# Patient Record
Sex: Female | Born: 1980 | Race: White | Hispanic: No | Marital: Married | Smoking: Never smoker
Health system: Southern US, Community
[De-identification: ages and names within clinical notes are randomized; demographics above are authoritative.]

## PROBLEM LIST (undated history)

## (undated) DIAGNOSIS — K5120 Ulcerative (chronic) proctitis without complications: Secondary | ICD-10-CM

## (undated) DIAGNOSIS — R197 Diarrhea, unspecified: Secondary | ICD-10-CM

## (undated) DIAGNOSIS — K529 Noninfective gastroenteritis and colitis, unspecified: Secondary | ICD-10-CM

## (undated) HISTORY — PX: NO PAST SURGERIES: SHX2092

## (undated) HISTORY — DX: Ulcerative (chronic) proctitis without complications: K51.20

## (undated) HISTORY — PX: COLONOSCOPY: SHX174

## (undated) HISTORY — DX: Diarrhea, unspecified: R19.7

---

## 2007-02-14 ENCOUNTER — Ambulatory Visit: Payer: Self-pay | Admitting: Internal Medicine

## 2008-04-03 ENCOUNTER — Inpatient Hospital Stay: Payer: Self-pay | Admitting: Unknown Physician Specialty

## 2009-04-20 ENCOUNTER — Inpatient Hospital Stay: Payer: Self-pay

## 2009-04-26 ENCOUNTER — Encounter: Admission: RE | Admit: 2009-04-26 | Discharge: 2009-05-25 | Payer: Self-pay

## 2011-11-22 ENCOUNTER — Inpatient Hospital Stay: Payer: Self-pay

## 2011-11-22 LAB — CBC WITH DIFFERENTIAL/PLATELET
Basophil %: 0.3 %
Eosinophil #: 0 10*3/uL (ref 0.0–0.7)
Eosinophil %: 0 %
HCT: 32.1 % — ABNORMAL LOW (ref 35.0–47.0)
Lymphocyte #: 2.1 10*3/uL (ref 1.0–3.6)
MCH: 31.1 pg (ref 26.0–34.0)
MCV: 93 fL (ref 80–100)
Monocyte #: 1.1 10*3/uL — ABNORMAL HIGH (ref 0.0–0.7)
Monocyte %: 8.5 %
Neutrophil #: 9.5 10*3/uL — ABNORMAL HIGH (ref 1.4–6.5)
Platelet: 270 10*3/uL (ref 150–440)
RBC: 3.46 10*6/uL — ABNORMAL LOW (ref 3.80–5.20)
WBC: 12.7 10*3/uL — ABNORMAL HIGH (ref 3.6–11.0)

## 2011-11-24 LAB — HEMATOCRIT: HCT: 31.1 % — ABNORMAL LOW (ref 35.0–47.0)

## 2011-12-26 ENCOUNTER — Ambulatory Visit: Payer: Self-pay | Admitting: Family Medicine

## 2014-02-14 ENCOUNTER — Observation Stay: Payer: Self-pay | Admitting: Obstetrics and Gynecology

## 2014-02-14 LAB — CBC WITH DIFFERENTIAL/PLATELET
Basophil #: 0.1 10*3/uL (ref 0.0–0.1)
Basophil %: 0.5 %
Eosinophil #: 0 10*3/uL (ref 0.0–0.7)
Eosinophil %: 0.3 %
HCT: 34.9 % — ABNORMAL LOW (ref 35.0–47.0)
HGB: 11.4 g/dL — ABNORMAL LOW (ref 12.0–16.0)
Lymphocyte #: 2.5 10*3/uL (ref 1.0–3.6)
Lymphocyte %: 19.5 %
MCH: 29.6 pg (ref 26.0–34.0)
MCHC: 32.7 g/dL (ref 32.0–36.0)
MCV: 91 fL (ref 80–100)
Monocyte #: 1.2 x10 3/mm — ABNORMAL HIGH (ref 0.2–0.9)
Monocyte %: 9.2 %
Neutrophil #: 8.9 10*3/uL — ABNORMAL HIGH (ref 1.4–6.5)
Neutrophil %: 70.5 %
PLATELETS: 243 10*3/uL (ref 150–440)
RBC: 3.85 10*6/uL (ref 3.80–5.20)
RDW: 13.9 % (ref 11.5–14.5)
WBC: 12.7 10*3/uL — AB (ref 3.6–11.0)

## 2014-02-19 ENCOUNTER — Inpatient Hospital Stay: Payer: Self-pay | Admitting: Obstetrics and Gynecology

## 2014-02-19 LAB — CBC WITH DIFFERENTIAL/PLATELET
BASOS PCT: 0.9 %
Basophil #: 0.1 10*3/uL (ref 0.0–0.1)
Eosinophil #: 0 10*3/uL (ref 0.0–0.7)
Eosinophil %: 0.4 %
HCT: 34 % — ABNORMAL LOW (ref 35.0–47.0)
HGB: 11.4 g/dL — AB (ref 12.0–16.0)
Lymphocyte #: 2.1 10*3/uL (ref 1.0–3.6)
Lymphocyte %: 17.3 %
MCH: 30.4 pg (ref 26.0–34.0)
MCHC: 33.6 g/dL (ref 32.0–36.0)
MCV: 91 fL (ref 80–100)
MONO ABS: 1 x10 3/mm — AB (ref 0.2–0.9)
Monocyte %: 8.5 %
Neutrophil #: 8.9 10*3/uL — ABNORMAL HIGH (ref 1.4–6.5)
Neutrophil %: 72.9 %
Platelet: 239 10*3/uL (ref 150–440)
RBC: 3.76 10*6/uL — ABNORMAL LOW (ref 3.80–5.20)
RDW: 14 % (ref 11.5–14.5)
WBC: 12.2 10*3/uL — ABNORMAL HIGH (ref 3.6–11.0)

## 2014-02-21 LAB — HEMATOCRIT: HCT: 31.2 % — ABNORMAL LOW (ref 35.0–47.0)

## 2014-07-03 ENCOUNTER — Ambulatory Visit: Payer: Self-pay | Admitting: Nurse Practitioner

## 2014-07-03 LAB — COMPREHENSIVE METABOLIC PANEL
AST: 27 U/L (ref 15–37)
Albumin: 3.6 g/dL (ref 3.4–5.0)
Alkaline Phosphatase: 69 U/L
Anion Gap: 6 — ABNORMAL LOW (ref 7–16)
BUN: 10 mg/dL (ref 7–18)
Bilirubin,Total: 0.3 mg/dL (ref 0.2–1.0)
CO2: 28 mmol/L (ref 21–32)
CREATININE: 0.78 mg/dL (ref 0.60–1.30)
Calcium, Total: 9.2 mg/dL (ref 8.5–10.1)
Chloride: 106 mmol/L (ref 98–107)
EGFR (African American): >60
EGFR (Non-African Amer.): >60
Glucose: 94 mg/dL (ref 65–99)
Osmolality: 278 (ref 275–301)
POTASSIUM: 4 mmol/L (ref 3.5–5.1)
SGPT (ALT): 31 U/L
Sodium: 140 mmol/L (ref 136–145)
Total Protein: 7.6 g/dL (ref 6.4–8.2)

## 2014-07-03 LAB — CBC WITH DIFFERENTIAL/PLATELET
BASOS PCT: 0.5 %
Basophil #: 0 10*3/uL (ref 0.0–0.1)
Eosinophil #: 0.1 10*3/uL (ref 0.0–0.7)
Eosinophil %: 1.3 %
HCT: 37.9 % (ref 35.0–47.0)
HGB: 12.8 g/dL (ref 12.0–16.0)
LYMPHS ABS: 2.5 10*3/uL (ref 1.0–3.6)
LYMPHS PCT: 26.6 %
MCH: 30.5 pg (ref 26.0–34.0)
MCHC: 33.8 g/dL (ref 32.0–36.0)
MCV: 90 fL (ref 80–100)
MONO ABS: 0.9 x10 3/mm (ref 0.2–0.9)
MONOS PCT: 9.2 %
NEUTROS ABS: 5.9 10*3/uL (ref 1.4–6.5)
Neutrophil %: 62.4 %
Platelet: 319 10*3/uL (ref 150–440)
RBC: 4.19 10*6/uL (ref 3.80–5.20)
RDW: 11.9 % (ref 11.5–14.5)
WBC: 9.4 10*3/uL (ref 3.6–11.0)

## 2014-10-03 ENCOUNTER — Ambulatory Visit: Payer: Self-pay | Admitting: Gastroenterology

## 2015-02-03 NOTE — H&P (Signed)
L&D Evaluation:  History:  HPI 34 year old G4P3003 at 2733w2d [facility] presenting with regular contractions increasing in intensity this evening.  No LOF, no VB, +FM.  Last cervical check in clinic 2/50/-3   Presents with contractions   Patient's Medical History No Chronic Illness   Patient's Surgical History none   Medications Pre Natal Vitamins   Allergies NKDA   Social History none   ROS:  ROS All systems were reviewed.  HEENT, CNS, GI, GU, Respiratory, CV, Renal and Musculoskeletal systems were found to be normal.   Exam:  Vital Signs stable  131/70   General painfully contracting   Mental Status clear   Chest clear   Abdomen gravid, tender with contractions   Estimated Fetal Weight Small for gestational age   Fetal Position vtx   Back no CVAT   Edema no edema   Pelvic no external lesions, 2.5/50/-3   Mebranes Intact   FHT normal rate with no decels, 130, moderate, + accels, no decels   Ucx irregular, q2-425min   Impression:  Impression Suspect early labor vs braxton hicks contractions   Plan:  Plan EFM/NST, monitor contractions and for cervical change   Comments 1) R/O labor - will recheck in 1hrs to see if any cervical change  2) Fetus - category I tracing, reactive NST     - S<D on recent fundal checks had growth scan scheduled for next week     - appropriate weight gain just under 30lbs this pregnancy     - previous deliveries term 6lbs 12 oz to 7lbs 13oz  3) PNL A positive / ABSC neg / RI / VZI / HBsAg neg / RPR NR / HIV neg / 1-hr 138 / GBS positive   4) TDAP declined 3/26 last received 2013  5) Breast / natural family planning  6) Disposition - pending recheck   Electronic Signatures: Lorrene ReidStaebler, Dareen Gutzwiller M (MD)  (Signed 22-May-15 21:29)  Authored: L&D Evaluation   Last Updated: 22-May-15 21:29 by Lorrene ReidStaebler, Quaid Yeakle M (MD)

## 2015-02-03 NOTE — H&P (Signed)
L&D Evaluation:  History Expanded:  HPI 34 year old G4P3003 at 8194w0d by EDD of 02/26/14 presents with c/o gush of fluid tonight at 1940 and regular ctx. Denies VB or decreased FM. [facility]   Blood Type (Maternal) A positive   Group B Strep Results Maternal (Result >5wks must be treated as unknown) positive   Maternal HIV Negative   Maternal Syphilis Ab Nonreactive   Maternal Varicella Immune   Rubella Results (Maternal) immune   Presents with contractions, leaking fluid   Patient's Medical History No Chronic Illness   Patient's Surgical History none   Medications Pre Natal Vitamins   Allergies NKDA   Social History none   ROS:  ROS All systems were reviewed.  HEENT, CNS, GI, GU, Respiratory, CV, Renal and Musculoskeletal systems were found to be normal.   Exam:  Vital Signs stable   General no apparent distress   Mental Status clear   Chest clear   Heart no murmur/gallop/rubs   Abdomen gravid, tender with contractions   Estimated Fetal Weight Small for gestational age, 6.5 per leopolds   Fetal Position vtx   Back no CVAT   Edema no edema   Pelvic no external lesions, 2.5/75/-2   Mebranes Ruptured, fern & nitrizine positive   Description clear   FHT normal rate with no decels, category 1 tracing   Ucx irregular, q 4-5 min   Impression:  Impression SROM, early labor   Plan:  Plan EFM/NST, monitor contractions and for cervical change, antibiotics for GBBS prophylaxis   Comments Recheck after antibiotics infused x 4 hours, if unchanged consider Pitocin augmentation. Encouraged ambulation with intemittant monitoring if desired.   TDAP declined 3/26 last received 2013 Breastfeeding / natural family planning   Electronic Signatures: Vella KohlerBrothers, Ronda Rajkumar K (CNM)  (Signed 27-May-15 21:26)  Authored: L&D Evaluation   Last Updated: 27-May-15 21:26 by Vella KohlerBrothers, Keigan Girten K (CNM)

## 2015-02-03 NOTE — H&P (Signed)
L&D Evaluation:  History:   HPI 34 yo G3P2002 @ 39.4wks EDC 12/03/11 by LMP presents with SROM.  She called last evening to report that she had a gush of fluid but had not started with ctxs.  She wanted to stay at home until ctxs began.  She was urged to come in since she is GBS pos to have abx started.  On arrival, her cvx was 3 cm.  She denied bleeding or other symptoms.  [facility]  Preg uncomplicated.  She presents with a birth plan which is on her chart.    Presents with leaking fluid    Patient's Medical History No Chronic Illness    Patient's Surgical History none    Medications Pre Natal Vitamins    Allergies NKDA    Social History none    Family History Non-Contributory   ROS:   ROS All systems were reviewed.  HEENT, CNS, GI, GU, Respiratory, CV, Renal and Musculoskeletal systems were found to be normal.   Exam:   Vital Signs stable    General no apparent distress    Mental Status clear    Chest clear    Heart normal sinus rhythm    Abdomen gravid, non-tender    Fetal Position Vertex    Edema no edema    Pelvic 3.5/50/-2    Mebranes Ruptured    Description clear    FHT normal rate with no decels    Fetal Heart Rate 120    Ucx irregular    Skin dry   Impression:   Impression early labor, PROM, GBS pos   Plan:   Plan monitor contractions and for cervical change, antibiotics for GBBS prophylaxis    Comments Pt has a birth plan which includes the desire not to have Pitocin started.  I have had an extensive discussion with her about this.  She had PROM at 6pm and arrived to the hospital at about 8:45 pm.  She now reports ctxs q 10 min apart, although they are graphing only irregularly.  I have discussed that Pitocin should be started since she is already 6 hours ruptured and prolonged rupture increases her risk for chorio.  She does not desire to have Pitocin started.  She prefers that she be reassessed at 6am.  She understands that at that time, she  will be 12hrs ruptured.  If she has not changed her cervix in that time, there is no telling how long her latent phase will last.  At 18 hrs she will be considered PPROM and her risk of chorio significantly increases.  She understands that chorio can lead to fever and tachycardia in her and fetal tachycardia.  If her baby's FHR is non-reassuring, that could increase her risk for C-section.  She has been told that, she would be re-evaluated by the next provider at 8am, if I am not available at 6am.  She understands all of these risks and continues to decline Pitocin.   Electronic Signatures: Senaida LangeWeaver-Lee, Caitriona Sundquist (MD)  (Signed [phone]-Feb-13 00:55)  Authored: L&D Evaluation   Last Updated: 27-Feb-13 00:55 by Senaida LangeWeaver-Lee, Charbel Los (MD)

## 2016-04-07 ENCOUNTER — Ambulatory Visit (INDEPENDENT_AMBULATORY_CARE_PROVIDER_SITE_OTHER): Payer: Self-pay | Admitting: Gastroenterology

## 2016-04-07 ENCOUNTER — Encounter: Payer: Self-pay | Admitting: Gastroenterology

## 2016-04-07 VITALS — BP 131/68 | HR 70 | Temp 98.9°F | Ht 67.0 in | Wt 130.0 lb

## 2016-04-07 DIAGNOSIS — K580 Irritable bowel syndrome with diarrhea: Secondary | ICD-10-CM

## 2016-04-07 NOTE — Progress Notes (Signed)
   Primary Care Physician: No PCP Per Patient  Primary Gastroenterologist:  Dr. Midge Miniumarren Brianna Bennett  Chief Complaint  Patient presents with  . Follow up Colonoscopy  . Diarrhea    HPI: Desiree Jacobson is a 35 y.o. female here for follow-up of her diarrhea.  The patient had a colonoscopy without any source of the diarrhea being found.  The patient did have hyperplastic polyps that were removed.  The patient had been going to a homeopathic doctor and states that her symptoms were better but then they started to return.  She also reports that she has some bright red blood per rectum that she believes is from her hemorrhoids. The patient states that she was found to have a weakness in the wall of her rectum by her gynecologist. She is also concerned that she may have malabsorption despite no significant symptoms of malabsorption. She reports that she has bloating with her diarrhea.  The patient's diarrhea is usually once or twice in the morning and that is all she has to the rest the day.  She tried to avoid milk products but did not see any change in her symptoms.  She denies that she has woken up by the diarrhea.  No current outpatient prescriptions on file.   No current facility-administered medications for this visit.    Allergies as of 04/07/2016  . (No Known Allergies)    ROS:  General: Negative for anorexia, weight loss, fever, chills, fatigue, weakness. ENT: Negative for hoarseness, difficulty swallowing , nasal congestion. CV: Negative for chest pain, angina, palpitations, dyspnea on exertion, peripheral edema.  Respiratory: Negative for dyspnea at rest, dyspnea on exertion, cough, sputum, wheezing.  GI: See history of present illness. GU:  Negative for dysuria, hematuria, urinary incontinence, urinary frequency, nocturnal urination.  Endo: Negative for unusual weight change.    Physical Examination:   BP 131/68 mmHg  Pulse 70  Temp(Src) 98.9 F (37.2 C) (Oral)  Ht 5\' 7"  (1.702 m)  Wt  130 lb (58.968 kg)  BMI 20.36 kg/m2  General: Well-nourished, well-developed in no acute distress.  Eyes: No icterus. Conjunctivae pink. Mouth: Oropharyngeal mucosa moist and pink , no lesions erythema or exudate. Lungs: Clear to auscultation bilaterally. Non-labored. Heart: Regular rate and rhythm, no murmurs rubs or gallops.  Abdomen: Bowel sounds are normal, nontender, nondistended, no hepatosplenomegaly or masses, no abdominal bruits or hernia , no rebound or guarding.   Extremities: No lower extremity edema. No clubbing or deformities. Neuro: Alert and oriented x 3.  Grossly intact. Skin: Warm and dry, no jaundice.   Psych: Alert and cooperative, normal mood and affect.  Labs:    Imaging Studies: No results found.  Assessment and Plan:   Desiree Jacobson is a 35 y.o. y/o female with a history consistent with irritable bowel syndrome. The patient has been explained the disease and has been told to increase fiber in her diet to help solidify her stools.  The patient has been offered blood tests to check for any malabsorption but she reports that she is going to see her primary care physician soon.  The patient has also been told that she can take Imodium if her diarrhea becomes worse.  The patient has been explained the plan and agrees with it.   Note: This dictation was prepared with Dragon dictation along with smaller phrase technology. Any transcriptional errors that result from this process are unintentional.

## 2016-04-12 ENCOUNTER — Ambulatory Visit: Payer: Self-pay | Admitting: Gastroenterology

## 2016-06-15 ENCOUNTER — Telehealth: Payer: Self-pay | Admitting: Internal Medicine

## 2016-06-15 NOTE — Telephone Encounter (Signed)
noted 

## 2016-06-22 ENCOUNTER — Encounter: Payer: Self-pay | Admitting: Internal Medicine

## 2016-06-22 NOTE — Telephone Encounter (Signed)
Dr. Leone PayorGessner reviewed records and has accepted patient. Ok to schedule Direct colon but patient wants an OV first. Appointment Scheduled.

## 2016-07-06 ENCOUNTER — Encounter: Payer: Self-pay | Admitting: Internal Medicine

## 2016-07-13 ENCOUNTER — Ambulatory Visit: Payer: Self-pay | Admitting: *Deleted

## 2016-07-13 VITALS — Ht 66.0 in | Wt 130.8 lb

## 2016-07-13 DIAGNOSIS — R197 Diarrhea, unspecified: Secondary | ICD-10-CM

## 2016-07-13 NOTE — Progress Notes (Signed)
Pt had wanted OV with Dr. Leone PayorGessner prior to colonoscopy but then had another episode of diarrhea with blood and mucous and decided to proceed with direct colonoscopy as she could get in sooner than office visit appointment would allow.

## 2016-07-13 NOTE — Progress Notes (Signed)
Denies allergies to eggs or soy products. Denies complications with sedation or anesthesia. Denies O2 use. Denies use of diet or weight loss medications.  Emmi instructions given for colonoscopy.  

## 2016-07-26 ENCOUNTER — Encounter: Payer: Self-pay | Admitting: Internal Medicine

## 2016-07-26 ENCOUNTER — Telehealth: Payer: Self-pay | Admitting: Internal Medicine

## 2016-07-26 ENCOUNTER — Ambulatory Visit (AMBULATORY_SURGERY_CENTER): Payer: Self-pay | Admitting: Internal Medicine

## 2016-07-26 VITALS — BP 99/52 | HR 60 | Temp 98.7°F | Resp 16 | Ht 66.0 in | Wt 130.0 lb

## 2016-07-26 DIAGNOSIS — K6289 Other specified diseases of anus and rectum: Secondary | ICD-10-CM

## 2016-07-26 DIAGNOSIS — R197 Diarrhea, unspecified: Secondary | ICD-10-CM

## 2016-07-26 MED ORDER — SODIUM CHLORIDE 0.9 % IV SOLN: 500.0000 mL | INTRAVENOUS | Status: DC

## 2016-07-26 MED ORDER — HYDROCORTISONE ACETATE 25 MG RE SUPP: 25.0000 mg | Freq: Every day | RECTAL | 1 refills | Status: DC

## 2016-07-26 MED ORDER — MESALAMINE 1000 MG RE SUPP: 1000.0000 mg | Freq: Every day | RECTAL | 1 refills | Status: DC

## 2016-07-26 NOTE — Telephone Encounter (Signed)
Can see if hydrocortisone suppositories are cheaper 25 mg # 30 1 RF  Dx is proctitis

## 2016-07-26 NOTE — Telephone Encounter (Signed)
Any alternatives you want to try to canasa?  Expensive

## 2016-07-26 NOTE — Telephone Encounter (Signed)
Patient's husband notified  

## 2016-07-26 NOTE — Op Note (Signed)
[facility] Patient Name: Desiree Jacobson Procedure Date: 07/26/2016 8:30 AM MRN: [id number] Endoscopist: Iva Boop , MD Age: 35 Referring MD:  Date of Birth: [birth year] Gender: Female Account #: [id number] Procedure:                Colonoscopy Indications:              Clinically significant diarrhea of unexplained                            origin, Hematochezia Medicines:                Propofol per Anesthesia, Monitored Anesthesia Care Procedure:                Pre-Anesthesia Assessment:                           - Prior to the procedure, a History and Physical                            was performed, and patient medications and                            allergies were reviewed. The patient's tolerance of                            previous anesthesia was also reviewed. The risks                            and benefits of the procedure and the sedation                            options and risks were discussed with the patient.                            All questions were answered, and informed consent                            was obtained. Prior Anticoagulants: The patient has                            taken no previous anticoagulant or antiplatelet                            agents. ASA Grade Assessment: II - A patient with                            mild systemic disease. After reviewing the risks                            and benefits, the patient was deemed in                            satisfactory condition to undergo the procedure.  After obtaining informed consent, the colonoscope                            was passed under direct vision. Throughout the                            procedure, the patient's blood pressure, pulse, and                            oxygen saturations were monitored continuously. The                            Model CF-HQ190L [id number]) scope was introduced                            through the anus and  advanced to the the cecum,                            identified by appendiceal orifice and ileocecal                            valve. The colonoscopy was performed without                            difficulty. The patient tolerated the procedure                            well. The quality of the bowel preparation was                            excellent. The bowel preparation used was Miralax.                            The ileocecal valve, appendiceal orifice, and                            rectum were photographed. IC valve photo lost. Scope In: 8:38:29 AM Scope Out: 8:55:03 AM Scope Withdrawal Time: 0 hours 13 minutes 23 seconds  Total Procedure Duration: 0 hours 16 minutes 34 seconds  Findings:                 The perianal and digital rectal examinations were                            normal.                           A diffuse area of moderately erythematous, inflamed                            and ulcerated mucosa was found in the rectum.                            Biopsies were taken with a cold forceps for  histology. Verification of patient identification                            for the specimen was done. Estimated blood loss was                            minimal.                           The exam was otherwise without abnormality on                            direct and retroflexion views.                           Biopsies for histology were taken with a cold                            forceps from the right colon and left colon for                            evaluation of microscopic colitis. Complications:            No immediate complications. Estimated Blood Loss:     Estimated blood loss was minimal. Impression:               - Erythematous, inflamed and ulcerated mucosa in                            the rectum. Biopsied.                           - The examination was otherwise normal on direct                            and  retroflexion views.                           - Biopsies were taken with a cold forceps from the                            right colon and left colon for evaluation of                            microscopic colitis. Recommendation:           - Patient has a contact number available for                            emergencies. The signs and symptoms of potential                            delayed complications were discussed with the                            patient. Return to normal activities tomorrow.  Written discharge instructions were provided to the                            patient.                           - Resume previous diet.                           - Continue present medications.                           - Await pathology results.                           - LOOKS LIKE ULCERATIVE PROCTITIS                           WILL DISCUSS EMPIRIC CANASA SUPPOSITORIES WITHHER                            AND POSSIBLY START TODAY WHILE WAITING FOR PATHOLOGY                           - Repeat colonoscopy is recommended. The                            colonoscopy date will be determined after pathology                            results from today's exam become available for                            review. Iva Booparl E Jimi Giza, MD 07/26/2016 9:08:24 AM This report has been signed electronically.

## 2016-07-26 NOTE — Patient Instructions (Addendum)
I see proctitis - tiny ulcers and inflammation in the rectum.  Biopsies pending.  Will discuss treatment - can start with suppositories now.  I appreciate the opportunity to care for you. Iva Booparl E. Gessner, MD, FACG   YOU HAD AN ENDOSCOPIC PROCEDURE TODAY AT THE [facility]:   Refer to the procedure report that was given to you for any specific questions about what was found during the examination.  If the procedure report does not answer your questions, please call your gastroenterologist to clarify.  If you requested that your care partner not be given the details of your procedure findings, then the procedure report has been included in a sealed envelope for you to review at your convenience later.  YOU SHOULD EXPECT: Some feelings of bloating in the abdomen. Passage of more gas than usual.  Walking can help get rid of the air that was put into your GI tract during the procedure and reduce the bloating. If you had a lower endoscopy (such as a colonoscopy or flexible sigmoidoscopy) you may notice spotting of blood in your stool or on the toilet paper. If you underwent a bowel prep for your procedure, you may not have a normal bowel movement for a few days.  Please Note:  You might notice some irritation and congestion in your nose or some drainage.  This is from the oxygen used during your procedure.  There is no need for concern and it should clear up in a day or so.  SYMPTOMS TO REPORT IMMEDIATELY:   Following lower endoscopy (colonoscopy or flexible sigmoidoscopy):  Excessive amounts of blood in the stool  Significant tenderness or worsening of abdominal pains  Swelling of the abdomen that is new, acute  Fever of 100F or higher  For urgent or emergent issues, a gastroenterologist can be reached at any hour by calling [phone].   DIET:  We do recommend a small meal at first, but then you may proceed to your regular diet.  Drink plenty of fluids but you  should avoid alcoholic beverages for 24 hours.  ACTIVITY:  You should plan to take it easy for the rest of today and you should NOT DRIVE or use heavy machinery until tomorrow (because of the sedation medicines used during the test).    FOLLOW UP: Our staff will call the number listed on your records the next business day following your procedure to check on you and address any questions or concerns that you may have regarding the information given to you following your procedure. If we do not reach you, we will leave a message.  However, if you are feeling well and you are not experiencing any problems, there is no need to return our call.  We will assume that you have returned to your regular daily activities without incident.  If any biopsies were taken you will be contacted by phone or by letter within the next 1-3 weeks.  Please call us at [phone] if you have not heard about the biopsies in 3 weeks.   SIGNATURES/CONFIDENTIALITY: You and/or your care partner have signed paperwork which will be entered into your electronic medical record.  These signatures attest to the fact that that the information above on your After Visit Summary has been reviewed and is understood.  Full responsibility of the confidentiality of this discharge information lies with you and/or your care-partner.  Dr. Raelyn EnsignGesssner sent your prescription into your [facility]- insert one suppository into your rectum every  night  Continue your normal medications

## 2016-07-27 ENCOUNTER — Telehealth: Payer: Self-pay | Admitting: *Deleted

## 2016-07-27 NOTE — Telephone Encounter (Signed)
  Follow up Call-  Call back number 07/26/2016  Post procedure Call Back phone  # [phone]  Permission to leave phone message Yes  Some recent data might be hidden     No answer, unable to leave message.

## 2016-07-28 NOTE — Progress Notes (Signed)
Biopsies confirm ulcerative proctitis Needs to use either hydrocortisone or Canasa suppositories and see me on 2-3 months  LEC no letter or recall

## 2016-07-29 ENCOUNTER — Telehealth: Payer: Self-pay | Admitting: Internal Medicine

## 2016-07-29 NOTE — Telephone Encounter (Signed)
Pt has been notified and will try hydrocortisone suppositories and will follow up in 2-3 months

## 2016-08-30 ENCOUNTER — Ambulatory Visit: Payer: Self-pay | Admitting: Internal Medicine

## 2016-08-30 ENCOUNTER — Telehealth: Payer: Self-pay | Admitting: Internal Medicine

## 2016-08-30 MED ORDER — HYDROCORTISONE ACETATE 25 MG RE SUPP: 25.0000 mg | Freq: Every day | RECTAL | 1 refills | Status: DC

## 2016-08-30 NOTE — Telephone Encounter (Signed)
Please refill x 3 same Rx (qhs)  She should try to go to every other night and if ok x few weeks then every third night if doing well currently  If any ? Let me know

## 2016-08-30 NOTE — Telephone Encounter (Signed)
Patient notified. She verbalized understanding

## 2016-08-30 NOTE — Telephone Encounter (Signed)
Left message for patient to call back  

## 2016-08-30 NOTE — Telephone Encounter (Signed)
Does she need to continue suppositories until appt?

## 2016-08-31 ENCOUNTER — Encounter: Payer: Self-pay | Admitting: Internal Medicine

## 2016-09-26 NOTE — L&D Delivery Note (Signed)
Called into room patient reports pelvic pressure with the urge to push. SVE: 10/100/+1, vertex, BBOW. AROM clear fluid moderate amount at 1357.   Spontaneous vaginal birth of liveborn female patient at 1400. Infant immediately to maternal abdomen. Delayed cord clamping, three (3) vessel cord. Receiving nurse present at bedside for birth. APGARS: 8, 9. Weight pending.   Pitocin infusing. Spontaneous delivery of placenta at 1405. Deep first degree perineal laceration repaired with 3-0 vicyrl rapide with adequate epidural anesthesia. QBL: pending. Vault check completed. Counts correct x 2.   Initiate routine postpartum care and orders. Mom to postpartum.  Baby to Couplet care / Skin to Skin.  FOB present and bedside and overjoyed with birth of Melanee Spryan.    Gunnar BullaJenkins Michelle Sherrelle Prochazka, CNM 07/20/2017, 2:31 PM

## 2016-10-20 ENCOUNTER — Encounter: Payer: Self-pay | Admitting: Gastroenterology

## 2016-10-24 ENCOUNTER — Ambulatory Visit (INDEPENDENT_AMBULATORY_CARE_PROVIDER_SITE_OTHER): Payer: Self-pay | Admitting: Internal Medicine

## 2016-10-24 ENCOUNTER — Encounter: Payer: Self-pay | Admitting: Internal Medicine

## 2016-10-24 VITALS — BP 100/70 | HR 68 | Ht 66.0 in | Wt 133.0 lb

## 2016-10-24 DIAGNOSIS — K51211 Ulcerative (chronic) proctitis with rectal bleeding: Secondary | ICD-10-CM

## 2016-10-24 DIAGNOSIS — L309 Dermatitis, unspecified: Secondary | ICD-10-CM

## 2016-10-24 DIAGNOSIS — L290 Pruritus ani: Secondary | ICD-10-CM

## 2016-10-24 NOTE — Progress Notes (Signed)
   Desiree MillinKari L Ringler 35 y.o. 10-12-1980 [id number]  Assessment & Plan:   Encounter Diagnoses  Name Primary?  . 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 Perianal dermatitis   . Pruritus ani    She is improved overall re: proctitis. Will see how she does off Tx  Perianal changes and itching - looks like some sort of chronic irritation perianal area - she is seeing her dermatologist in March and I recommended she show this to them - she has been embarrassed about this and only now bringing it to attention. ? Primary process vs behavioral issue vs both.  See me routinely in 1 year sooner prn I explained we will need to sort out what her natural hx is - could have recurrent proctitis sxs - could have more extensive dz  She has HC suppositories at home - can restart if needed - and to let me know I would like to retry Canasa if has a chronic need for Tx  I appreciate the opportunity to care for this patient.  WU:JWJXCc:Mark Sherlene ShamsF Miller, MD     Subjective:   Chief Complaint: f/u proctitis HPI Here doing well - had tapered HC suppositories and has been off x 1 week. Seems ok overall - had rapid response to Tx as bleeding stopped w/in 1 week. Stools slightly incosnistent - soft vs formed.  Also has long hx pruritus ani sxs - says had yeast infection at age 89 and has suffered with itching and sxs ever since - has tried many topical agents. Has been embarrassed and has not discussed w/ other providers. Thinks sometimes may be behavioral issues. Has derm appt march and says she will discuss  Medications, allergies, past medical history, past surgical history, family history and social history are reviewed and updated in the EMR.   Review of Systems As above  Objective:   Physical Exam BP 100/70   Pulse 68   Ht 5\' 6"  (1.676 m)   Wt 133 lb (60.3 kg)   LMP 10/21/2016   BMI 21.47 kg/m  NAD  Ivar DrapeAmanda Ferri PA-S present  Perianal exam: Perianal changes of hypopigmentation and some  pink border chages with some lichenification - no ulcers.  + papules on buttocks  15 minutes time spent with patient > half in counseling coordination of care

## 2016-10-24 NOTE — Patient Instructions (Addendum)
  Per Dr Leone PayorGessner stay off your medicines and call us if you have problems.   Follow up with Dr Leone PayorGessner in a year or sooner if needed.   Talk to your Dermatologist about your anal rash and itching.  Today we are giving you a form for a complimentary membership in the crohn's and colitis foundation of [place].   I appreciate the opportunity to care for you. Stan Headarl Gessner, MD, [facility]

## 2016-11-16 ENCOUNTER — Other Ambulatory Visit: Payer: Self-pay | Admitting: Obstetrics & Gynecology

## 2016-11-16 DIAGNOSIS — R2231 Localized swelling, mass and lump, right upper limb: Secondary | ICD-10-CM

## 2016-11-21 ENCOUNTER — Telehealth: Payer: Self-pay | Admitting: Internal Medicine

## 2016-11-21 NOTE — Telephone Encounter (Signed)
Spoke with Desiree Jacobson and she said she is not having any proctitis flares currently but that you said to call back if she needed medicine.  She is under a plan that is cost sharing and so if something was sent in by the end of the month it would fall under this.  She just found out she is EXPECTING so it would have to be safe for the baby.  Please advise Sir, thank you.

## 2016-11-21 NOTE — Telephone Encounter (Signed)
Canasa 1000 mg qhs # 30 no refill - keep on hand if she needs - call me if she has sxs   This and the hydrocortisone she has used in past should be ok if needed during pregnancy  Limited systemic absorption of these meds  Congratulations!

## 2016-11-22 MED ORDER — MESALAMINE 1000 MG RE SUPP: 1000.0000 mg | Freq: Every evening | RECTAL | 0 refills | Status: DC | PRN

## 2016-11-22 NOTE — Telephone Encounter (Signed)
I passed along Dr Marvell FullerGessner's message and [facility] as [facility].  She will call us if she has to use it.  She said thanks for the wishes, she hasn't even told her husband yet, going to surprise him.  She just found out yesterday.  I told her there are coupons online.

## 2016-11-30 ENCOUNTER — Other Ambulatory Visit: Payer: Self-pay | Admitting: Obstetrics & Gynecology

## 2016-11-30 ENCOUNTER — Encounter: Payer: Self-pay | Admitting: Radiology

## 2016-11-30 ENCOUNTER — Ambulatory Visit
Admission: RE | Admit: 2016-11-30 | Discharge: 2016-11-30 | Disposition: A | Discharge status: Home / Self Care | Payer: Self-pay | Source: Ambulatory Visit | Attending: Obstetrics & Gynecology | Admitting: Obstetrics & Gynecology

## 2016-11-30 DIAGNOSIS — R2231 Localized swelling, mass and lump, right upper limb: Secondary | ICD-10-CM | POA: Insufficient documentation

## 2017-01-04 ENCOUNTER — Encounter: Payer: Self-pay | Admitting: Certified Nurse Midwife

## 2017-01-04 ENCOUNTER — Ambulatory Visit (INDEPENDENT_AMBULATORY_CARE_PROVIDER_SITE_OTHER): Payer: Self-pay | Admitting: Certified Nurse Midwife

## 2017-01-04 VITALS — BP 113/67 | HR 63 | Ht 66.0 in | Wt 135.2 lb

## 2017-01-04 DIAGNOSIS — Z3A10 10 weeks gestation of pregnancy: Secondary | ICD-10-CM

## 2017-01-04 DIAGNOSIS — N926 Irregular menstruation, unspecified: Secondary | ICD-10-CM

## 2017-01-04 DIAGNOSIS — Z113 Encounter for screening for infections with a predominantly sexual mode of transmission: Secondary | ICD-10-CM

## 2017-01-04 DIAGNOSIS — Z3481 Encounter for supervision of other normal pregnancy, first trimester: Secondary | ICD-10-CM

## 2017-01-04 DIAGNOSIS — Z3490 Encounter for supervision of normal pregnancy, unspecified, unspecified trimester: Secondary | ICD-10-CM | POA: Insufficient documentation

## 2017-01-04 DIAGNOSIS — Z1389 Encounter for screening for other disorder: Secondary | ICD-10-CM

## 2017-01-04 LAB — POCT URINE PREGNANCY: Preg Test, Ur: POSITIVE — AB

## 2017-01-04 NOTE — Progress Notes (Signed)
Subjective:    Desiree Jacobson is a 36 y.o. female who presents for evaluation of amenorrhea. She believes she could be pregnant. Pregnancy is desired. Sexual Activity: single partner, contraception: none. Current symptoms also include: nausea. Last period was normal.   Patient's last menstrual period was 10/21/2016 (exact date). The following portions of the patient's history were reviewed and updated as appropriate: allergies, current medications, past family history, past medical history, past social history, past surgical history and problem list.  Review of Systems Pertinent items are noted in HPI.     Objective:    BP 113/67   Pulse 63   Ht  (1.676 m)   Wt 135 lb 3.2 oz (61.3 kg)   LMP 10/21/2016 (Exact Date)   BMI 21.82 kg/m  General: alert, cooperative, appears stated age and no acute distress    Lab Review Urine HCG: positive    Assessment:    Absence of menstruation.     Plan:    Pregnancy Test:   Positive. Briefly discussed pre-natal care options. She declines OB nurse visit requesting to have labs drawn today and to return at her 12wks NOB physical.  Additionally she is self pay and is requesting  Minimal testing.  Pregnancy folder given.  Encouraged well-balanced diet, plenty of rest when needed, pre-natal vitamins daily and walking for exercise. Discussed self-help for nausea, avoiding OTC medications until consulting provider or pharmacist, other than Tylenol as needed, minimal caffeine (1-2 cups daily) and avoiding alcohol. She will schedule her initial OB visit Feel free to call with any questions

## 2017-01-04 NOTE — Patient Instructions (Signed)
Pregnancy and Zika Virus Disease Zika virus disease, or Zika, is an illness that can spread to people from mosquitoes that carry the virus. It may also spread from person to person through infected body fluids. Zika first occurred in [place], but recently it has spread to new areas. The virus occurs in tropical climates. The location of Zika continues to change. Most people who become infected with Zika virus do not develop serious illness. However, Zika may cause birth defects in an unborn baby whose mother is infected with the virus. It may also increase the risk of miscarriage. What are the symptoms of Zika virus disease? In many cases, people who have been infected with Zika virus do not develop any symptoms. If symptoms appear, they usually start about a week after the person is infected. Symptoms are usually mild. They may include:  Fever.  Rash.  Red eyes.  Joint pain. How does Zika virus disease spread? The main way that [place] virus spreads is through the bite of a certain type of mosquito. Unlike most types of mosquitos, which bite only at night, the type of mosquito that carries Zika virus bites both at night and during the day. Zika virus can also spread through sexual contact, through a blood transfusion, and from a mother to her baby before or during birth. Once you have had Zika virus disease, it is unlikely that you will get it again. Can I pass Zika to my baby during pregnancy? Yes, Zika can pass from a mother to her baby before or during birth. What problems can Zika cause for my baby? A woman who is infected with Zika virus while pregnant is at risk of having her baby born with a condition in which the brain or head is smaller than expected (microcephaly). Babies who have microcephaly can have developmental delays, seizures, hearing problems, and vision problems. Having Zika virus disease during pregnancy can also increase the risk of miscarriage. How can Zika virus disease be  prevented? There is no vaccine to prevent Zika. The best way to prevent the disease is to avoid infected mosquitoes and avoid exposure to body fluids that can spread the virus. Avoid any possible exposure to [place] by taking the following precautions. For women and their sex partners:  Avoid traveling to high-risk areas. The locations where [place] is being reported change often. To identify high-risk areas, check the CDC travel website: CreditChaos.com.ee  If you or your sex partner must travel to a high-risk area, talk with a health care provider before and after traveling.  Take all precautions to avoid mosquito bites if you live in, or travel to, any of the high-risk areas. Insect repellents are safe to use during pregnancy.  Ask your health care provider when it is safe to have sexual contact. For women:  If you are pregnant or trying to become pregnant, avoid sexual contact with persons who may have been exposed to [place] virus, persons who have possible symptoms of Zika, or persons whose history you are unsure about. If you choose to have sexual contact with someone who may have been exposed to [place] virus, use condoms correctly during the entire duration of sexual activity, every time. Do not share sexual devices, as you may be exposed to body fluids.  Ask your health care provider about when it is safe to attempt pregnancy after a possible exposure to [place] virus. What steps should I take to avoid mosquito bites? Take these steps to avoid mosquito bites when you are  in a high-risk area:  Wear loose clothing that covers your arms and legs.  Limit your outdoor activities.  Do not open windows unless they have window screens.  Sleep under mosquito nets.  Use insect repellent. The best insect repellents have:  DEET, picaridin, oil of lemon eucalyptus (OLE), or IR3535 in them.  Higher amounts of an active ingredient in them.  Remember that insect repellents are safe to use  during pregnancy.  Do not use OLE on children who are younger than 15 years of age. Do not use insect repellent on babies who are younger than 70 months of age.  Cover your child's stroller with mosquito netting. Make sure the netting fits snugly and that any loose netting does not cover your child's mouth or nose. Do not use a blanket as a mosquito-protection cover.  Do not apply insect repellent underneath clothing.  If you are using sunscreen, apply the sunscreen before applying the insect repellent.  Treat clothing with permethrin. Do not apply permethrin directly to your skin. Follow label directions for safe use.  Get rid of standing water, where mosquitoes may reproduce. Standing water is often found in items such as buckets, bowls, animal food dishes, and flowerpots. When you return from traveling to any high-risk area, continue taking actions to protect yourself against mosquito bites for 3 weeks, even if you show no signs of illness. This will prevent spreading Zika virus to uninfected mosquitoes. What should I know about the sexual transmission of Zika? People can spread Zika to their sexual partners during vaginal, anal, or oral sex, or by sharing sexual devices. Many people with [place] do not develop symptoms, so a person could spread the disease without knowing that they are infected. The greatest risk is to women who are pregnant or who may become pregnant. Zika virus can live longer in semen than it can live in blood. Couples can prevent sexual transmission of the virus by:  Using condoms correctly during the entire duration of sexual activity, every time. This includes vaginal, anal, and oral sex.  Not sharing sexual devices. Sharing increases your risk of being exposed to body fluid from another person.  Avoiding all sexual activity until your health care provider says it is safe. Should I be tested for Zika virus? A sample of your blood can be tested for Zika virus. A pregnant  woman should be tested if she may have been exposed to the virus or if she has symptoms of Zika. She may also have additional tests done during her pregnancy, such ultrasound testing. Talk with your health care provider about which tests are recommended. This information is not intended to replace advice given to you by your health care provider. Make sure you discuss any questions you have with your health care provider. Document Released: 06/03/2015 Document Revised: 02/18/2016 Document Reviewed: 05/27/2015 Elsevier Interactive Patient Education  2017 [employer].

## 2017-01-04 NOTE — Addendum Note (Signed)
Addended by: Jackquline Denmark on: 01/04/2017 01:07 PM   Modules accepted: Orders

## 2017-01-05 LAB — RUBELLA SCREEN: Rubella Antibodies, IGG: 3.32 index (ref >0.99)

## 2017-01-05 LAB — URINALYSIS, ROUTINE W REFLEX MICROSCOPIC
BILIRUBIN UA: NEGATIVE
GLUCOSE, UA: NEGATIVE
Leukocytes, UA: NEGATIVE
NITRITE UA: NEGATIVE
PH UA: 5 (ref 5.0–7.5)
PROTEIN UA: NEGATIVE
RBC UA: NEGATIVE
Specific Gravity, UA: 1.012 (ref 1.005–1.030)
Urobilinogen, Ur: 0.2 mg/dL (ref 0.2–1.0)

## 2017-01-05 LAB — OB RESULTS CONSOLE VARICELLA ZOSTER ANTIBODY, IGG: VARICELLA IGG: IMMUNE

## 2017-01-05 LAB — RH TYPE: RH TYPE: POSITIVE

## 2017-01-05 LAB — RPR: RPR: NONREACTIVE

## 2017-01-05 LAB — HEMOGLOBIN AND HEMATOCRIT, BLOOD
Hematocrit: 34.4 % (ref 34.0–46.6)
Hemoglobin: 11.3 g/dL (ref 11.1–15.9)

## 2017-01-05 LAB — ABO

## 2017-01-05 LAB — VARICELLA ZOSTER ANTIBODY, IGG: VARICELLA: 663 {index} (ref >165)

## 2017-01-05 LAB — ANTIBODY SCREEN: Antibody Screen: NEGATIVE

## 2017-01-05 LAB — HEPATITIS B SURFACE ANTIGEN: Hepatitis B Surface Ag: NEGATIVE

## 2017-01-06 LAB — GC/CHLAMYDIA PROBE AMP
Chlamydia trachomatis, NAA: NEGATIVE
Neisseria gonorrhoeae by PCR: NEGATIVE

## 2017-01-09 LAB — CULTURE, OB URINE

## 2017-01-09 LAB — URINE CULTURE, OB REFLEX

## 2017-01-18 ENCOUNTER — Ambulatory Visit (INDEPENDENT_AMBULATORY_CARE_PROVIDER_SITE_OTHER): Payer: Self-pay | Admitting: Certified Nurse Midwife

## 2017-01-18 ENCOUNTER — Encounter: Payer: Self-pay | Admitting: Certified Nurse Midwife

## 2017-01-18 VITALS — BP 108/54 | HR 67 | Wt 138.2 lb

## 2017-01-18 DIAGNOSIS — Z124 Encounter for screening for malignant neoplasm of cervix: Secondary | ICD-10-CM

## 2017-01-18 DIAGNOSIS — Z3481 Encounter for supervision of other normal pregnancy, first trimester: Secondary | ICD-10-CM

## 2017-01-18 LAB — POCT URINALYSIS DIPSTICK
Bilirubin, UA: NEGATIVE
Blood, UA: NEGATIVE
Glucose, UA: NEGATIVE
KETONES UA: NEGATIVE
LEUKOCYTES UA: NEGATIVE
Nitrite, UA: NEGATIVE
PROTEIN UA: NEGATIVE
Spec Grav, UA: 1.015 (ref 1.010–1.025)
UROBILINOGEN UA: 0.2 U/dL
pH, UA: 6 (ref 5.0–8.0)

## 2017-01-18 NOTE — Patient Instructions (Signed)

## 2017-01-18 NOTE — Progress Notes (Signed)
NEW OB HISTORY AND PHYSICAL  SUBJECTIVE:       Desiree Jacobson is a 36 y.o. [id number] female, Patient's last menstrual period was 10/21/2016 (exact date)., Estimated Date of Delivery: 07/28/2017.  Presents today for NOB physical exam. She has no unusual complaints.  Gynecologic History Patient's last menstrual period was 10/21/2016 (exact date). Normal Contraception: none Last Pap: 2015. Results were: normal per pt  Obstetric History OB History  Gravida Para Term Preterm AB Living  SAB TAB Ectopic Multiple Live Births          4    # Outcome Date GA Lbr Len/2nd Weight Sex Delivery Anes PTL Lv  5 Current           4 Term 02/20/14 [redacted]w[redacted]d  6.1 oz (0.174 kg) F Vag-Spont   LIV  3 Term 11/23/11 [redacted]w[redacted]d  7 lb 3.2 oz (3.266 kg) M Vag-Spont   LIV  2 Term 04/21/09 [redacted]w[redacted]d  7 lb 6.4 oz (3.357 kg) M Vag-Spont   LIV  1 Term 04/04/08 [redacted]w[redacted]d  7 lb 2.4 oz (3.243 kg) M Vag-Spont  N LIV      Past Medical History:  Diagnosis Date  . Diarrhea   . Ulcerative proctitis [facility])     Past Surgical History:  Procedure Laterality Date  . COLONOSCOPY  09/2014, 06/2016  . NO PAST SURGERIES      Current Outpatient Prescriptions on File Prior to Visit  Medication Sig Dispense Refill  . COD LIVER OIL PO Take 1 capsule by mouth daily.    . folic acid (FOLVITE) 800 MCG tablet Take 400 mcg by mouth daily.    . Probiotic Product (PROBIOTIC-10 PO) Take 1 capsule by mouth daily.     No current facility-administered medications on file prior to visit.     No Known Allergies  Social History   Social History  . Marital status: Married    Spouse name: N/A  . Number of children: 4  . Years of education: N/A   Occupational History  . homemaker    Social History Main Topics  . Smoking status: Never Smoker  . Smokeless tobacco: Never Used  . Alcohol use No  . Drug use: No  . Sexual activity: Yes    Partners: Male    Birth control/ protection: None   Other Topics Concern  . Not on file   Social  History Narrative  . No narrative on file    Family History  Problem Relation Age of Onset  . Cancer Mother   . Brain cancer Mother   . Hypertension Father   . Skin cancer Sister   . Colon cancer Neg Hx     The following portions of the patient's history were reviewed and updated as appropriate: allergies, current medications, past OB history, past medical history, past surgical history, past family history, past social history, and problem list.  OBJECTIVE: Initial Physical Exam (New OB)  GENERAL APPEARANCE: alert, well appearing, in no apparent distress, oriented to person, place and time HEAD: normocephalic, atraumatic MOUTH: mucous membranes moist, pharynx normal without lesions THYROID: no thyromegaly or masses present BREASTS: Declined, stating she had it done recently @ [facility].  LUNGS: clear to auscultation, no wheezes, rales or rhonchi, symmetric air entry HEART: regular rate and rhythm, no murmurs ABDOMEN: soft, nontender, nondistended, no abnormal masses, no epigastric pain EXTREMITIES: no redness or tenderness in the calves or thighs SKIN: normal coloration and turgor, no  rashes LYMPH NODES: no adenopathy palpable NEUROLOGIC: alert, oriented, normal speech, no focal findings or movement disorder noted  PELVIC EXAM EXTERNAL GENITALIA: normal appearing vulva with no masses, tenderness or lesions VAGINA: no abnormal discharge or lesions CERVIX: no lesions or cervical motion tenderness, contact bleeding with Pap UTERUS: gravid ADNEXA: no masses palpable and nontender OB EXAM PELVIMETRY: appears adequate RECTUM: exam not indicated  ASSESSMENT: Normal pregnancy  PLAN: Will follow up with pap results. New OB counseling:The patient has been given an overview regarding routine prenatal care. Recommendations regarding diet, weight gain, and exercise in pregnancy were given. Prenatal testing, optional genetic testing, and ultrasound use in pregnancy were reviewed. Benefits of  Breast Feeding were discussed. The patient is encouraged to consider nursing her baby post partum. [name] 4 wks.   Doreene Burke, CNM

## 2017-01-19 NOTE — Addendum Note (Signed)
Addended by: Jackquline Denmark on: 01/19/2017 11:59 AM   Modules accepted: Orders

## 2017-01-21 ENCOUNTER — Encounter: Payer: Self-pay | Admitting: Certified Nurse Midwife

## 2017-01-21 LAB — PAP IG W/ RFLX HPV ASCU: PAP Smear Comment: 0

## 2017-02-15 ENCOUNTER — Other Ambulatory Visit (INDEPENDENT_AMBULATORY_CARE_PROVIDER_SITE_OTHER): Payer: Self-pay

## 2017-02-15 ENCOUNTER — Other Ambulatory Visit: Payer: Self-pay | Admitting: Obstetrics and Gynecology

## 2017-02-15 ENCOUNTER — Ambulatory Visit (INDEPENDENT_AMBULATORY_CARE_PROVIDER_SITE_OTHER): Payer: Self-pay | Admitting: Obstetrics and Gynecology

## 2017-02-15 VITALS — BP 114/60 | HR 71 | Wt 140.8 lb

## 2017-02-15 DIAGNOSIS — R001 Bradycardia, unspecified: Secondary | ICD-10-CM

## 2017-02-15 DIAGNOSIS — Z3492 Encounter for supervision of normal pregnancy, unspecified, second trimester: Secondary | ICD-10-CM

## 2017-02-15 DIAGNOSIS — O368390 Maternal care for abnormalities of the fetal heart rate or rhythm, unspecified trimester, not applicable or unspecified: Secondary | ICD-10-CM

## 2017-02-15 LAB — POCT URINALYSIS DIPSTICK
BILIRUBIN UA: NEGATIVE
Glucose, UA: NEGATIVE
KETONES UA: 5
Nitrite, UA: NEGATIVE
PH UA: 6 (ref 5.0–8.0)
Protein, UA: NEGATIVE
RBC UA: NEGATIVE
Spec Grav, UA: 1.015 (ref 1.010–1.025)
Urobilinogen, UA: 0.2 E.U./dL

## 2017-02-15 NOTE — Progress Notes (Signed)
[name]- Discussed benedryl use, discussed fetal arrythmia and did u/s while here, FHR would jump between 50-144 with irregular rhythym noted throughout. ultrasound reveals:

## 2017-02-15 NOTE — Progress Notes (Signed)
[name]- pt is having insomnia issues, is using benadryl, otherwise she is doing well

## 2017-02-22 ENCOUNTER — Ambulatory Visit (INDEPENDENT_AMBULATORY_CARE_PROVIDER_SITE_OTHER): Payer: Self-pay | Admitting: Certified Nurse Midwife

## 2017-02-22 ENCOUNTER — Encounter: Payer: Self-pay | Admitting: Certified Nurse Midwife

## 2017-02-22 VITALS — BP 113/53 | HR 86 | Wt 140.1 lb

## 2017-02-22 DIAGNOSIS — Z3492 Encounter for supervision of normal pregnancy, unspecified, second trimester: Secondary | ICD-10-CM

## 2017-02-22 LAB — POCT URINALYSIS DIPSTICK
BILIRUBIN UA: NEGATIVE
Glucose, UA: NEGATIVE
KETONES UA: NEGATIVE
Leukocytes, UA: NEGATIVE
Nitrite, UA: NEGATIVE
PH UA: 6 (ref 5.0–8.0)
Protein, UA: NEGATIVE
RBC UA: NEGATIVE
Spec Grav, UA: 1.01 (ref 1.010–1.025)
Urobilinogen, UA: 0.2 E.U./dL

## 2017-02-22 NOTE — Progress Notes (Signed)
[name], doing well no complaints. FHT: arrhythmia present with ascultation. Dr. Greggory KeeneFrancesco consulted on plan of care. Pt to return in 2 wks for ascultation and anatomy scan. Referral to [facility] to consult at 22 wks if arrhythmia has not resolved.  Desiree BurkeAnnie Yareli Jacobson, CNM

## 2017-02-22 NOTE — Patient Instructions (Signed)

## 2017-02-27 ENCOUNTER — Other Ambulatory Visit: Payer: Self-pay | Admitting: Certified Nurse Midwife

## 2017-02-27 DIAGNOSIS — Z3689 Encounter for other specified antenatal screening: Secondary | ICD-10-CM

## 2017-03-09 ENCOUNTER — Ambulatory Visit (INDEPENDENT_AMBULATORY_CARE_PROVIDER_SITE_OTHER): Payer: Self-pay | Admitting: Certified Nurse Midwife

## 2017-03-09 ENCOUNTER — Ambulatory Visit (INDEPENDENT_AMBULATORY_CARE_PROVIDER_SITE_OTHER): Payer: Self-pay

## 2017-03-09 ENCOUNTER — Encounter: Payer: Self-pay | Admitting: Certified Nurse Midwife

## 2017-03-09 VITALS — BP 106/54 | HR 70 | Wt 141.8 lb

## 2017-03-09 DIAGNOSIS — Z3492 Encounter for supervision of normal pregnancy, unspecified, second trimester: Secondary | ICD-10-CM

## 2017-03-09 DIAGNOSIS — O368390 Maternal care for abnormalities of the fetal heart rate or rhythm, unspecified trimester, not applicable or unspecified: Secondary | ICD-10-CM

## 2017-03-09 DIAGNOSIS — Z3482 Encounter for supervision of other normal pregnancy, second trimester: Secondary | ICD-10-CM

## 2017-03-09 DIAGNOSIS — B951 Streptococcus, group B, as the cause of diseases classified elsewhere: Secondary | ICD-10-CM

## 2017-03-09 NOTE — Progress Notes (Signed)
[name]-Pt doing well, no questions. [facility] appointment due to fetal arrhythmia scheduled for Monday, 03/13/2017 1400. US finding reviewed with pt and spouse, anatomy WNL. Discussed red flag symptoms and when to call. RTC x 4 weeks for [name] or sooner if needed.   ULTRASOUND REPORT  Location: [facility] Date of Service: 03/09/17  Indications:Anatomy U/S Findings:  Mason JimSingleton intrauterine pregnancy is visualized with FHR at 139 BPM. Biometrics give an (U/S) Gestational age of 35 2/7 weeks and an (U/S) EDD of 07/25/17; this correlates with the clinically established EDD of 07/28/17.  Fetal presentation is Vertex.  EFW: 343g (12oz). Placenta: posterior, grade 0, central card insert, 7.1cm from cervical os. AFI: Adequate with MVP of 4.4 cm.  Anatomic survey is complete and normal; Gender - female  .   Ovaries are not seen. Survey of the adnexa demonstrates no adnexal masses. There is no free peritoneal fluid in the cul de sac.  Impression: 1. 20 2/7 week Viable Singleton Intrauterine pregnancy by U/S. 2. (U/S) EDD is consistent with Clinically established (LMP) EDD of 07/28/17. 3. Normal Anatomy Scan  Recommendations: 1.Clinical correlation with the patient's History and Physical Exam.

## 2017-03-09 NOTE — Patient Instructions (Signed)
Pregnancy and Travel Most pregnant woman can safely travel until the last month of the pregnancy. However, pregnant women with medical problems or problems with their pregnancy should limit or avoid travel. The best time to travel is between 14 and 28 weeks of the pregnancy. During this period, morning sickness should be minimal and other problems are less likely to develop. General travel tips Before you go:  Discuss your trip with your health care provider and get examined shortly before you go.  Get a copy of your medical records and be sure to take them with you.  Try to get names of doctors and hospitals in the area you will be visiting.  Pack your pillow if you can.  Get a good night's sleep the night before you make your trip.  During your trip:  Ask for locations of doctors and hospitals if you did not do this before leaving.  Wear flat, comfortable shoes.  Eat a balanced diet, drink a lot of fluids, and take your vitamins and supplements.  Do not wear yourself out.  Do not ride on a motorcycle.  Rest. If you spent a lot of time traveling, lie down for 30 or more minutes with your feet slightly raised after you reach your destination.  Tips for traveling to a foreign country Before you go:  Ask your health care provider if there are any medicines that are safe to take if you get diarrhea, constipation, nausea, or vomiting.  Make copies of your medical records in case you lose the originals.  During your trip:  Do not eat uncooked foods of any kind.  Drink bottled water and do not use ice.  Wash fruits and vegetables with hot, soapy water.  Only drink pasteurized milk.  Tips for traveling by car  Wear your seat belt properly.  If you are in the front seat, sit as far away from the dashboard as possible to avoid getting hit hard if the air bag deploys in an accident.  Stop about every 2 hours to use the restroom and walk around. This helps the circulation in  your legs.  Keep water, crackers, and fruit in the car.  Do not travel for more than 6 hours a day. Tips for traveling by bus  Before making a reservation, ask whether your bus will have a restroom.  Take water, crackers, and fruit with you.  Get out and walk around if and when the bus stops.  Move your arms and legs when seated. This helps with your circulation. Tips for traveling by train Before making a reservation, ask if your train will have a sleeping car and more than one restroom. Tips for traveling by airplane  Before booking your trip, ask about the airline's rules about pregnancy. Pregnant women may be restricted from [place] after a certain time of the pregnancy. Every airline has its own rules and regulations.  Ask whether the airplane cabin will be pressurized. Do not board an unpressurized plane that will fly above 7,000 ft (2,100 km).  Try to get a bulkhead or an aisle seat.  Wear layered clothing because the temperature in the cabin can change.  Take water, crackers, and fruit with you on the airplane.  Put all your medicines and medical records in your carry-on bag.  Avoid drinks with caffeine and do not eat a big meal.  Do not walk around the airplane to stretch your legs.  Move your arms and legs while sitting to help with your  circulation.  Wear your seat belt at all times. Tips for traveling by cruise ship  Before booking your trip, ask the following questions: ? Are pregnant women allowed on the cruise ship? ? Is there a medical facility and health care provider on board? ? Does the ship dock in cities where there are health care providers and medical facilities?  Before booking your trip, ask your health care provider if: ? It is safe for you to take medicines if you get seasick. ? It is safe for you to wear acupressure wristbands to prevent getting seasick. If your health care provider says it is safe, consider purchasing one. This information is  not intended to replace advice given to you by your health care provider. Make sure you discuss any questions you have with your health care provider. Document Released: 08/25/2008 Document Revised: 02/18/2016 Document Reviewed: 08/09/2013 Elsevier Interactive Patient Education  2017 [place]. Common Medications Safe in Pregnancy  Acne:      Constipation:  Benzoyl Peroxide     Colace  Clindamycin      Dulcolax Suppository  Topica Erythromycin     Fibercon  Salicylic Acid      Metamucil         Miralax AVOID:        Senakot   Accutane    Cough:  Retin-A       Cough Drops  Tetracycline      Phenergan w/ Codeine if Rx  Minocycline      Robitussin (Plain & DM)  Antibiotics:     Crabs/Lice:  Ceclor       RID  Cephalosporins    AVOID:  E-Mycins      Kwell  Keflex  Macrobid/Macrodantin   Diarrhea:  Penicillin      Kao-Pectate  Zithromax      Imodium AD         PUSH FLUIDS AVOID:       Cipro     Fever:  Tetracycline      Tylenol (Regular or Extra  Minocycline       Strength)  Levaquin      Extra Strength-Do not          Exceed 8 tabs/24 hrs Caffeine:        '200mg'$ /day (equiv. To 1 cup of coffee or  approx. 3 12 oz sodas)         Gas: Cold/Hayfever:       Gas-X  Benadryl      Mylicon  Claritin       Phazyme  **Claritin-D        Chlor-Trimeton    Headaches:  Dimetapp      ASA-Free Excedrin  Drixoral-Non-Drowsy     Cold Compress  Mucinex (Guaifenasin)     Tylenol (Regular or Extra  Sudafed/Sudafed-12 Hour     Strength)  **Sudafed PE Pseudoephedrine   Tylenol Cold & Sinus     Vicks Vapor Rub  Zyrtec  **AVOID if Problems With Blood Pressure         Heartburn: Avoid lying down for at least 1 hour after meals  Aciphex      Maalox     Rash:  Milk of Magnesia     Benadryl    Mylanta       1% Hydrocortisone Cream  Pepcid  Pepcid Complete   Sleep Aids:  Prevacid      Ambien   Prilosec       Benadryl  Rolaids  Chamomile Tea  Tums (Limit  4/day)     Unisom  Zantac       Tylenol PM         Warm milk-add vanilla or  Hemorrhoids:       Sugar for taste  Anusol/Anusol H.C.  (RX: Analapram 2.5%)  Sugar Substitutes:  Hydrocortisone OTC     Ok in moderation  Preparation H      Tucks        Vaseline lotion applied to tissue with wiping    Herpes:     Throat:  Acyclovir      Oragel  Famvir  Valtrex     Vaccines:         Flu Shot Leg Cramps:       *Gardasil  Benadryl      Hepatitis A         Hepatitis B Nasal Spray:       Pneumovax  Saline Nasal Spray     Polio Booster         Tetanus Nausea:       Tuberculosis test or PPD  Vitamin B6 25 mg TID   AVOID:    Dramamine      *Gardasil  Emetrol       Live Poliovirus  Ginger Root 250 mg QID    MMR (measles, mumps &  High Complex Carbs @ Bedtime    rebella)  Sea Bands-Accupressure    Varicella (Chickenpox)  Unisom 1/2 tab TID     *No known complications           If received before Pain:         Known pregnancy;   Darvocet       Resume series after  Lortab        Delivery  Percocet    Yeast:   Tramadol      Femstat  Tylenol 3      Gyne-lotrimin  Ultram       Monistat  Vicodin           MISC:         All Sunscreens           Hair Coloring/highlights          Insect Repellant's          (Including DEET)         Mystic Tans Round Ligament Pain The round ligament is a cord of muscle and tissue that helps to support the uterus. It can become a source of pain during pregnancy if it becomes stretched or twisted as the baby grows. The pain usually begins in the second trimester of pregnancy, and it can come and go until the baby is delivered. It is not a serious problem, and it does not cause harm to the baby. Round ligament pain is usually a short, sharp, and pinching pain, but it can also be a dull, lingering, and aching pain. The pain is felt in the lower side of the abdomen or in the groin. It usually starts deep in the groin and moves up to the outside of the hip area. Pain  can occur with:  A sudden change in position.  Rolling over in bed.  Coughing or sneezing.  Physical activity.  Follow these instructions at home: Watch your condition for any changes. Take these steps to help with your pain:  When the pain starts, relax. Then try: ? Sitting down. ? Flexing your knees up to your abdomen. ?  Lying on your side with one pillow under your abdomen and another pillow between your legs. ? Sitting in a warm bath for 15-20 minutes or until the pain goes away.  Take over-the-counter and prescription medicines only as told by your health care provider.  Move slowly when you sit and stand.  Avoid long walks if they cause pain.  Stop or lessen your physical activities if they cause pain.  Contact a health care provider if:  Your pain does not go away with treatment.  You feel pain in your back that you did not have before.  Your medicine is not helping. Get help right away if:  You develop a fever or chills.  You develop uterine contractions.  You develop vaginal bleeding.  You develop nausea or vomiting.  You develop diarrhea.  You have pain when you urinate. This information is not intended to replace advice given to you by your health care provider. Make sure you discuss any questions you have with your health care provider. Document Released: 06/21/2008 Document Revised: 02/18/2016 Document Reviewed: 11/19/2014 Elsevier Interactive Patient Education  [employer].

## 2017-03-13 ENCOUNTER — Telehealth: Payer: Self-pay | Admitting: *Deleted

## 2017-03-13 ENCOUNTER — Ambulatory Visit
Admission: RE | Admit: 2017-03-13 | Discharge: 2017-03-13 | Disposition: A | Discharge status: Home / Self Care | Payer: Self-pay | Source: Ambulatory Visit | Attending: Maternal and Fetal Medicine | Admitting: Maternal and Fetal Medicine

## 2017-03-13 ENCOUNTER — Ambulatory Visit: Payer: Self-pay

## 2017-03-13 DIAGNOSIS — B951 Streptococcus, group B, as the cause of diseases classified elsewhere: Secondary | ICD-10-CM

## 2017-03-13 DIAGNOSIS — Z3689 Encounter for other specified antenatal screening: Secondary | ICD-10-CM | POA: Insufficient documentation

## 2017-03-13 HISTORY — DX: Noninfective gastroenteritis and colitis, unspecified: K52.9

## 2017-03-13 NOTE — Telephone Encounter (Signed)
[facility].  Patient given date and appt. time along with directions.

## 2017-03-14 ENCOUNTER — Encounter: Payer: Self-pay | Admitting: Certified Nurse Midwife

## 2017-04-05 ENCOUNTER — Ambulatory Visit (INDEPENDENT_AMBULATORY_CARE_PROVIDER_SITE_OTHER): Payer: Self-pay | Admitting: Certified Nurse Midwife

## 2017-04-05 ENCOUNTER — Encounter: Payer: Self-pay | Admitting: Certified Nurse Midwife

## 2017-04-05 VITALS — BP 111/52 | HR 61 | Wt 143.1 lb

## 2017-04-05 DIAGNOSIS — Z3482 Encounter for supervision of other normal pregnancy, second trimester: Secondary | ICD-10-CM

## 2017-04-05 LAB — POCT URINALYSIS DIPSTICK
BILIRUBIN UA: NEGATIVE
GLUCOSE UA: NEGATIVE
Leukocytes, UA: NEGATIVE
NITRITE UA: NEGATIVE
Protein, UA: NEGATIVE
RBC UA: NEGATIVE
Spec Grav, UA: 1.01 (ref 1.010–1.025)
UROBILINOGEN UA: 0.2 U/dL
pH, UA: 6 (ref 5.0–8.0)

## 2017-04-05 NOTE — Progress Notes (Signed)
[name], MFM consult completed. Recommendations for weekly fht check for 3 wks if continue arrhythmia x 1 month then repeat echo. Today fetal heart rate has longer periods of regular rate. Denies LOF, vaginal bleeding, with occasional contractions (2-3 hr). Endorses good fetal movement. She states that her proctitis is flairing and inquire about use of Anucort. Instructed that she may use short term for period of 3-4 days.  She agrees to plan.  Return in 1 wk for HR check.  Doreene BurkeAnnie Luwanna Brossman, CNM

## 2017-04-05 NOTE — Progress Notes (Signed)
Pt voices no complaints.

## 2017-04-05 NOTE — Patient Instructions (Signed)

## 2017-04-12 ENCOUNTER — Ambulatory Visit (INDEPENDENT_AMBULATORY_CARE_PROVIDER_SITE_OTHER): Payer: Self-pay | Admitting: Certified Nurse Midwife

## 2017-04-12 ENCOUNTER — Encounter: Payer: Self-pay | Admitting: Certified Nurse Midwife

## 2017-04-12 VITALS — BP 99/47 | HR 75 | Wt 143.0 lb

## 2017-04-12 DIAGNOSIS — Z130 Encounter for screening for diseases of the blood and blood-forming organs and certain disorders involving the immune mechanism: Secondary | ICD-10-CM

## 2017-04-12 DIAGNOSIS — Z3483 Encounter for supervision of other normal pregnancy, third trimester: Secondary | ICD-10-CM

## 2017-04-12 DIAGNOSIS — Z131 Encounter for screening for diabetes mellitus: Secondary | ICD-10-CM

## 2017-04-12 LAB — POCT URINALYSIS DIPSTICK
BILIRUBIN UA: NEGATIVE
Glucose, UA: NEGATIVE
Ketones, UA: NEGATIVE
Leukocytes, UA: NEGATIVE
NITRITE UA: NEGATIVE
Protein, UA: NEGATIVE
RBC UA: NEGATIVE
SPEC GRAV UA: 1.01 (ref 1.010–1.025)
Urobilinogen, UA: 0.2 E.U./dL
pH, UA: 6 (ref 5.0–8.0)

## 2017-04-12 NOTE — Patient Instructions (Signed)

## 2017-04-12 NOTE — Progress Notes (Signed)
FHT check due to arrhythmia . FHT today 135-140's with no audible arrhythmia. Pt to return in one week to evaluate if next check no  audible arrhythmia then we will resume normal scheduling. Desiree Jacobson express today concern regarding 1 hr GTT she suffers with proctitis and has been on a very strict diet to help manage it. One of the triggers is spikes in her blood sugar. She has no history of GDM or risk factors.We discussed possible alteratives. At 28 wks we will do a fasting glucose and a 2hr postprandial. She agrees to that plan. Follow up 1 wk.   Doreene BurkeAnnie Adaiah Jacobson, CNM

## 2017-04-19 ENCOUNTER — Ambulatory Visit (INDEPENDENT_AMBULATORY_CARE_PROVIDER_SITE_OTHER): Payer: Self-pay | Admitting: Obstetrics and Gynecology

## 2017-04-19 VITALS — BP 115/51 | HR 68 | Wt 145.7 lb

## 2017-04-19 DIAGNOSIS — Z3492 Encounter for supervision of normal pregnancy, unspecified, second trimester: Secondary | ICD-10-CM

## 2017-04-19 LAB — POCT URINALYSIS DIPSTICK
BILIRUBIN UA: NEGATIVE
Blood, UA: NEGATIVE
Glucose, UA: NEGATIVE
Ketones, UA: 5
LEUKOCYTES UA: NEGATIVE
NITRITE UA: NEGATIVE
PH UA: 6 (ref 5.0–8.0)
PROTEIN UA: NEGATIVE
Spec Grav, UA: 1.01 (ref 1.010–1.025)
Urobilinogen, UA: 0.2 E.U./dL

## 2017-04-19 NOTE — Progress Notes (Signed)
[name]- no fetal arrythmia noted glucose test as discussed next visit.

## 2017-04-19 NOTE — Progress Notes (Signed)
[name]- pt is doing well, denies any complaints 

## 2017-04-24 ENCOUNTER — Telehealth: Payer: Self-pay | Admitting: Certified Nurse Midwife

## 2017-04-24 NOTE — Telephone Encounter (Signed)
Please call patient - she LVM that she has a question about a vaginal infection

## 2017-04-24 NOTE — Telephone Encounter (Signed)
Pt is having sx of yeast inf, has been using monistat with relief

## 2017-05-02 ENCOUNTER — Ambulatory Visit (INDEPENDENT_AMBULATORY_CARE_PROVIDER_SITE_OTHER): Payer: Self-pay | Admitting: Obstetrics and Gynecology

## 2017-05-02 VITALS — BP 123/52 | HR 79 | Wt 146.0 lb

## 2017-05-02 DIAGNOSIS — Z3492 Encounter for supervision of normal pregnancy, unspecified, second trimester: Secondary | ICD-10-CM

## 2017-05-02 LAB — OB RESULTS CONSOLE GBS: GBS: POSITIVE

## 2017-05-02 LAB — POCT URINALYSIS DIPSTICK
BILIRUBIN UA: NEGATIVE
Blood, UA: NEGATIVE
Glucose, UA: NEGATIVE
Ketones, UA: NEGATIVE
NITRITE UA: NEGATIVE
PH UA: 6.5 (ref 5.0–8.0)
Protein, UA: NEGATIVE
Spec Grav, UA: 1.01 (ref 1.010–1.025)
Urobilinogen, UA: 0.2 E.U./dL

## 2017-05-02 NOTE — Progress Notes (Signed)
[name]- pt is doing well, she will do her glucola at her next visit, pt will think about tdap vaccine, she did sign blood consent

## 2017-05-02 NOTE — Progress Notes (Signed)
[name]- doing well, discussed glucola next visit.

## 2017-05-04 ENCOUNTER — Other Ambulatory Visit: Payer: Self-pay | Admitting: Obstetrics and Gynecology

## 2017-05-04 LAB — URINE CULTURE

## 2017-05-04 MED ORDER — AMOXICILLIN 500 MG PO CAPS: 500.0000 mg | ORAL_CAPSULE | Freq: Three times a day (TID) | ORAL | 2 refills | Status: DC

## 2017-05-05 ENCOUNTER — Other Ambulatory Visit: Payer: Self-pay

## 2017-05-05 ENCOUNTER — Encounter: Payer: Self-pay | Admitting: Certified Nurse Midwife

## 2017-05-17 ENCOUNTER — Other Ambulatory Visit: Payer: Self-pay

## 2017-05-17 ENCOUNTER — Encounter: Payer: Self-pay | Admitting: Certified Nurse Midwife

## 2017-05-22 ENCOUNTER — Encounter: Payer: Self-pay | Admitting: Certified Nurse Midwife

## 2017-05-22 ENCOUNTER — Other Ambulatory Visit: Payer: Self-pay

## 2017-05-22 ENCOUNTER — Ambulatory Visit (INDEPENDENT_AMBULATORY_CARE_PROVIDER_SITE_OTHER): Payer: Self-pay | Admitting: Certified Nurse Midwife

## 2017-05-22 VITALS — BP 113/65 | HR 106 | Wt 152.1 lb

## 2017-05-22 DIAGNOSIS — Z131 Encounter for screening for diabetes mellitus: Secondary | ICD-10-CM

## 2017-05-22 DIAGNOSIS — Z130 Encounter for screening for diseases of the blood and blood-forming organs and certain disorders involving the immune mechanism: Secondary | ICD-10-CM

## 2017-05-22 DIAGNOSIS — Z3483 Encounter for supervision of other normal pregnancy, third trimester: Secondary | ICD-10-CM

## 2017-05-22 LAB — POCT URINALYSIS DIPSTICK
BILIRUBIN UA: NEGATIVE
GLUCOSE UA: NEGATIVE
KETONES UA: NEGATIVE
NITRITE UA: NEGATIVE
PH UA: 6 (ref 5.0–8.0)
Protein, UA: NEGATIVE
Spec Grav, UA: 1.01 (ref 1.010–1.025)
Urobilinogen, UA: 0.2 E.U./dL

## 2017-05-22 NOTE — Progress Notes (Signed)
[name], doing well. Denies complaints. Discussed cord blood donation. She had a fasting blood sugar and will have a 2 hr pp bs today for her glucose testing due to ulcerative proctits. She will return in 2 wks for rob.   Doreene Burke, CNM

## 2017-05-22 NOTE — Patient Instructions (Signed)

## 2017-05-23 ENCOUNTER — Encounter: Payer: Self-pay | Admitting: Certified Nurse Midwife

## 2017-05-23 LAB — CBC
Hematocrit: 29.8 % — ABNORMAL LOW (ref 34.0–46.6)
Hemoglobin: 10.1 g/dL — ABNORMAL LOW (ref 11.1–15.9)
MCH: 29.4 pg (ref 26.6–33.0)
MCHC: 33.9 g/dL (ref 31.5–35.7)
MCV: 87 fL (ref 79–97)
Platelets: 249 10*3/uL (ref 150–379)
RBC: 3.44 x10E6/uL — AB (ref 3.77–5.28)
RDW: 14.3 % (ref 12.3–15.4)
WBC: 11.8 10*3/uL — AB (ref 3.4–10.8)

## 2017-05-23 LAB — GLUCOSE FASTING AND 2HR
Glucose, Plasma: 75 mg/dL (ref 65–99)
Glucose, Two-Hour Postprandial: 93 mg/dL (ref 65–139)

## 2017-06-06 ENCOUNTER — Ambulatory Visit (INDEPENDENT_AMBULATORY_CARE_PROVIDER_SITE_OTHER): Payer: Self-pay | Admitting: Certified Nurse Midwife

## 2017-06-06 VITALS — BP 112/68 | HR 89 | Wt 157.4 lb

## 2017-06-06 DIAGNOSIS — Z3483 Encounter for supervision of other normal pregnancy, third trimester: Secondary | ICD-10-CM

## 2017-06-06 LAB — POCT URINALYSIS DIPSTICK
Bilirubin, UA: NEGATIVE
Blood, UA: NEGATIVE
Glucose, UA: NEGATIVE
Ketones, UA: NEGATIVE
Nitrite, UA: NEGATIVE
Protein, UA: NEGATIVE
Spec Grav, UA: 1.01 (ref 1.010–1.025)
Urobilinogen, UA: 0.2 E.U./dL
pH, UA: 6 (ref 5.0–8.0)

## 2017-06-06 NOTE — Progress Notes (Signed)
[name]-Pt doing well, reports irregular BHC. Reviewed red flag symptoms and when to call. RTC x 2 weeks for [name] or sooner if needed.

## 2017-06-06 NOTE — Patient Instructions (Signed)

## 2017-06-13 ENCOUNTER — Telehealth: Payer: Self-pay | Admitting: Certified Nurse Midwife

## 2017-06-13 ENCOUNTER — Encounter: Payer: Self-pay | Admitting: *Deleted

## 2017-06-13 NOTE — Telephone Encounter (Signed)
Pt stated that she would like to pick up a letter stating that it would be beneficial for her to have massage therapy. Please advise. Thanks TNP

## 2017-06-14 NOTE — Telephone Encounter (Signed)
Done-ac 

## 2017-06-20 ENCOUNTER — Encounter: Payer: Self-pay | Admitting: Certified Nurse Midwife

## 2017-06-20 ENCOUNTER — Ambulatory Visit (INDEPENDENT_AMBULATORY_CARE_PROVIDER_SITE_OTHER): Payer: Self-pay | Admitting: Certified Nurse Midwife

## 2017-06-20 VITALS — BP 113/65 | HR 79 | Wt 160.0 lb

## 2017-06-20 DIAGNOSIS — Z3483 Encounter for supervision of other normal pregnancy, third trimester: Secondary | ICD-10-CM

## 2017-06-20 LAB — POCT URINALYSIS DIPSTICK
BILIRUBIN UA: NEGATIVE
GLUCOSE UA: NEGATIVE
KETONES UA: NEGATIVE
Nitrite, UA: NEGATIVE
Protein, UA: NEGATIVE
RBC UA: NEGATIVE
SPEC GRAV UA: 1.015 (ref 1.010–1.025)
UROBILINOGEN UA: 0.2 U/dL
pH, UA: 6.5 (ref 5.0–8.0)

## 2017-06-20 NOTE — Patient Instructions (Signed)

## 2017-06-20 NOTE — Progress Notes (Signed)
[name] doing well. She is having some common discomfort of pregnancy.    Doreene Burke, CNM

## 2017-06-27 ENCOUNTER — Encounter: Payer: Self-pay | Admitting: Certified Nurse Midwife

## 2017-06-27 DIAGNOSIS — K5120 Ulcerative (chronic) proctitis without complications: Secondary | ICD-10-CM | POA: Insufficient documentation

## 2017-07-05 ENCOUNTER — Ambulatory Visit (INDEPENDENT_AMBULATORY_CARE_PROVIDER_SITE_OTHER): Payer: Self-pay | Admitting: Certified Nurse Midwife

## 2017-07-05 VITALS — BP 114/72 | HR 64 | Wt 160.3 lb

## 2017-07-05 DIAGNOSIS — Z113 Encounter for screening for infections with a predominantly sexual mode of transmission: Secondary | ICD-10-CM

## 2017-07-05 DIAGNOSIS — Z369 Encounter for antenatal screening, unspecified: Secondary | ICD-10-CM

## 2017-07-05 DIAGNOSIS — Z3483 Encounter for supervision of other normal pregnancy, third trimester: Secondary | ICD-10-CM

## 2017-07-05 LAB — POCT URINALYSIS DIPSTICK
BILIRUBIN UA: NEGATIVE
Glucose, UA: NEGATIVE
Ketones, UA: NEGATIVE
NITRITE UA: NEGATIVE
PH UA: 6 (ref 5.0–8.0)
PROTEIN UA: NEGATIVE
RBC UA: NEGATIVE
Spec Grav, UA: 1.01 (ref 1.010–1.025)
UROBILINOGEN UA: 0.2 U/dL

## 2017-07-05 NOTE — Patient Instructions (Signed)

## 2017-07-05 NOTE — Progress Notes (Signed)
[name] doing well. She  Endorses good fetal movement. She has had some irregular contractions. GC/Chlamydia cultures today. GBS not necessary due to being GBS positive in urine. Labor precautions reviewed.  Follow up in 1 wk  Doreene Burke, CNM

## 2017-07-07 LAB — GC/CHLAMYDIA PROBE AMP
Chlamydia trachomatis, NAA: NEGATIVE
NEISSERIA GONORRHOEAE BY PCR: NEGATIVE

## 2017-07-12 ENCOUNTER — Ambulatory Visit (INDEPENDENT_AMBULATORY_CARE_PROVIDER_SITE_OTHER): Payer: Self-pay | Admitting: Obstetrics and Gynecology

## 2017-07-12 VITALS — BP 128/57 | HR 70 | Wt 162.9 lb

## 2017-07-12 DIAGNOSIS — Z3493 Encounter for supervision of normal pregnancy, unspecified, third trimester: Secondary | ICD-10-CM

## 2017-07-12 LAB — POCT URINALYSIS DIPSTICK
Bilirubin, UA: NEGATIVE
Blood, UA: NEGATIVE
Glucose, UA: NEGATIVE
Ketones, UA: NEGATIVE
Nitrite, UA: NEGATIVE
PH UA: 6 (ref 5.0–8.0)
PROTEIN UA: NEGATIVE
Spec Grav, UA: 1.01 (ref 1.010–1.025)
UROBILINOGEN UA: 0.2 U/dL

## 2017-07-12 NOTE — Progress Notes (Signed)
[name]- doing well, irregular BHC. Discussed epidural, as one before last had back pain for a while after and then last one the placed it a little higher and had no pain.

## 2017-07-12 NOTE — Progress Notes (Signed)
[name]- pt is having a lot of pelvic pressure 

## 2017-07-20 ENCOUNTER — Inpatient Hospital Stay
Admission: EM | Admit: 2017-07-20 | Discharge: 2017-07-21 | DRG: 807 | Disposition: A | Discharge status: Home / Self Care | Payer: Self-pay | Attending: Certified Nurse Midwife | Admitting: Certified Nurse Midwife

## 2017-07-20 ENCOUNTER — Inpatient Hospital Stay: Payer: Self-pay | Admitting: Registered Nurse

## 2017-07-20 ENCOUNTER — Encounter: Payer: Self-pay | Admitting: Certified Nurse Midwife

## 2017-07-20 DIAGNOSIS — O99824 Streptococcus B carrier state complicating childbirth: Secondary | ICD-10-CM | POA: Diagnosis present

## 2017-07-20 DIAGNOSIS — O9902 Anemia complicating childbirth: Principal | ICD-10-CM | POA: Diagnosis present

## 2017-07-20 DIAGNOSIS — Z3A38 38 weeks gestation of pregnancy: Secondary | ICD-10-CM

## 2017-07-20 DIAGNOSIS — D649 Anemia, unspecified: Secondary | ICD-10-CM | POA: Diagnosis present

## 2017-07-20 LAB — TYPE AND SCREEN
ABO/RH(D): A POS
ANTIBODY SCREEN: NEGATIVE

## 2017-07-20 LAB — CBC
HEMATOCRIT: 30 % — AB (ref 35.0–47.0)
Hemoglobin: 9.9 g/dL — ABNORMAL LOW (ref 12.0–16.0)
MCH: 27.9 pg (ref 26.0–34.0)
MCHC: 32.8 g/dL (ref 32.0–36.0)
MCV: 84.9 fL (ref 80.0–100.0)
Platelets: 265 10*3/uL (ref 150–440)
RBC: 3.54 MIL/uL — ABNORMAL LOW (ref 3.80–5.20)
RDW: 15.5 % — AB (ref 11.5–14.5)
WBC: 12.1 10*3/uL — ABNORMAL HIGH (ref 3.6–11.0)

## 2017-07-20 LAB — RAPID HIV SCREEN (HIV 1/2 AB+AG)
HIV 1/2 Antibodies: NONREACTIVE
HIV-1 P24 Antigen - HIV24: NONREACTIVE

## 2017-07-20 MED ORDER — OXYTOCIN 40 UNITS IN LACTATED RINGERS INFUSION - SIMPLE MED: 2.5000 [IU]/h | INTRAVENOUS | Status: DC

## 2017-07-20 MED ORDER — COCONUT OIL OIL
1.0000 | TOPICAL_OIL | Status: DC | PRN
Start: 2017-07-20 — End: 2017-07-21

## 2017-07-20 MED ORDER — SODIUM CHLORIDE 0.9 % IV SOLN
INTRAVENOUS | Status: AC
  Filled 2017-07-20: qty 2000

## 2017-07-20 MED ORDER — FLEET ENEMA 7-19 GM/118ML RE ENEM: 1.0000 | ENEMA | RECTAL | Status: DC | PRN

## 2017-07-20 MED ORDER — LIDOCAINE-EPINEPHRINE (PF) 1.5 %-1:200000 IJ SOLN
INTRAMUSCULAR | Status: DC | PRN
  Administered 2017-07-20: 3 mL via EPIDURAL

## 2017-07-20 MED ORDER — ACETAMINOPHEN 325 MG PO TABS: 650.0000 mg | ORAL_TABLET | ORAL | Status: DC | PRN

## 2017-07-20 MED ORDER — OXYCODONE-ACETAMINOPHEN 5-325 MG PO TABS
1.0000 | ORAL_TABLET | ORAL | Status: DC | PRN
Start: 2017-07-20 — End: 2017-07-20

## 2017-07-20 MED ORDER — OXYCODONE-ACETAMINOPHEN 5-325 MG PO TABS: 2.0000 | ORAL_TABLET | ORAL | Status: DC | PRN

## 2017-07-20 MED ORDER — BUTORPHANOL TARTRATE 2 MG/ML IJ SOLN: 1.0000 mg | INTRAMUSCULAR | Status: DC | PRN

## 2017-07-20 MED ORDER — ONDANSETRON HCL 4 MG/2ML IJ SOLN: 4.0000 mg | INTRAMUSCULAR | Status: DC | PRN

## 2017-07-20 MED ORDER — DIPHENHYDRAMINE HCL 25 MG PO CAPS: 25.0000 mg | ORAL_CAPSULE | Freq: Four times a day (QID) | ORAL | Status: DC | PRN

## 2017-07-20 MED ORDER — FENTANYL 2.5 MCG/ML W/ROPIVACAINE 0.15% IN NS 100 ML EPIDURAL (ARMC)
EPIDURAL | Status: DC | PRN
  Administered 2017-07-20: 12 mL/h via EPIDURAL

## 2017-07-20 MED ORDER — LACTATED RINGERS IV SOLN: 500.0000 mL | Freq: Once | INTRAVENOUS | Status: AC

## 2017-07-20 MED ORDER — AMMONIA AROMATIC IN INHA
RESPIRATORY_TRACT | Status: AC
  Filled 2017-07-20: qty 10

## 2017-07-20 MED ORDER — LIDOCAINE HCL (PF) 1 % IJ SOLN: 30.0000 mL | INTRAMUSCULAR | Status: DC | PRN

## 2017-07-20 MED ORDER — OXYTOCIN 40 UNITS IN LACTATED RINGERS INFUSION - SIMPLE MED
1.0000 m[IU]/min | INTRAVENOUS | Status: DC
  Administered 2017-07-20: 2 m[IU]/min via INTRAVENOUS
  Filled 2017-07-20: qty 1000

## 2017-07-20 MED ORDER — EPHEDRINE 5 MG/ML INJ: 10.0000 mg | INTRAVENOUS | Status: DC | PRN

## 2017-07-20 MED ORDER — LIDOCAINE HCL (PF) 1 % IJ SOLN
INTRAMUSCULAR | Status: AC
  Filled 2017-07-20: qty 30

## 2017-07-20 MED ORDER — LACTATED RINGERS IV SOLN
INTRAVENOUS | Status: DC
  Administered 2017-07-20 (×2): via INTRAVENOUS

## 2017-07-20 MED ORDER — ZOLPIDEM TARTRATE 5 MG PO TABS: 5.0000 mg | ORAL_TABLET | Freq: Every evening | ORAL | Status: DC | PRN

## 2017-07-20 MED ORDER — HYDROCORTISONE 1 % EX CREA
TOPICAL_CREAM | Freq: Four times a day (QID) | CUTANEOUS | Status: DC
  Administered 2017-07-20 – 2017-07-21 (×4): via TOPICAL
  Filled 2017-07-20: qty 28

## 2017-07-20 MED ORDER — LIDOCAINE HCL (PF) 1 % IJ SOLN
INTRAMUSCULAR | Status: DC | PRN
  Administered 2017-07-20: 3 mL via SUBCUTANEOUS

## 2017-07-20 MED ORDER — ONDANSETRON HCL 4 MG/2ML IJ SOLN: 4.0000 mg | Freq: Four times a day (QID) | INTRAMUSCULAR | Status: DC | PRN

## 2017-07-20 MED ORDER — OXYTOCIN 10 UNIT/ML IJ SOLN
INTRAMUSCULAR | Status: AC
  Filled 2017-07-20: qty 2

## 2017-07-20 MED ORDER — SENNOSIDES-DOCUSATE SODIUM 8.6-50 MG PO TABS: 2.0000 | ORAL_TABLET | ORAL | Status: DC

## 2017-07-20 MED ORDER — LACTATED RINGERS IV SOLN
500.0000 mL | INTRAVENOUS | Status: DC | PRN
  Administered 2017-07-20: 1000 mL via INTRAVENOUS

## 2017-07-20 MED ORDER — IBUPROFEN 600 MG PO TABS
600.0000 mg | ORAL_TABLET | Freq: Four times a day (QID) | ORAL | Status: DC
  Administered 2017-07-20 – 2017-07-21 (×3): 600 mg via ORAL
  Filled 2017-07-20 (×4): qty 1

## 2017-07-20 MED ORDER — PHENYLEPHRINE 40 MCG/ML (10ML) SYRINGE FOR IV PUSH (FOR BLOOD PRESSURE SUPPORT): 80.0000 ug | PREFILLED_SYRINGE | INTRAVENOUS | Status: DC | PRN

## 2017-07-20 MED ORDER — MISOPROSTOL 200 MCG PO TABS
ORAL_TABLET | ORAL | Status: AC
  Filled 2017-07-20: qty 4

## 2017-07-20 MED ORDER — ONDANSETRON HCL 4 MG PO TABS: 4.0000 mg | ORAL_TABLET | ORAL | Status: DC | PRN

## 2017-07-20 MED ORDER — DIPHENHYDRAMINE HCL 50 MG/ML IJ SOLN: 12.5000 mg | INTRAMUSCULAR | Status: DC | PRN

## 2017-07-20 MED ORDER — WITCH HAZEL-GLYCERIN EX PADS
1.0000 "application " | MEDICATED_PAD | CUTANEOUS | Status: DC | PRN
  Administered 2017-07-20: 1 via TOPICAL
  Filled 2017-07-20 (×2): qty 100

## 2017-07-20 MED ORDER — OXYTOCIN BOLUS FROM INFUSION
500.0000 mL | Freq: Once | INTRAVENOUS | Status: AC
  Administered 2017-07-20: 500 mL via INTRAVENOUS

## 2017-07-20 MED ORDER — SODIUM CHLORIDE 0.9 % IV SOLN
INTRAVENOUS | Status: AC
  Administered 2017-07-20: 1 g via INTRAVENOUS
  Filled 2017-07-20: qty 1000

## 2017-07-20 MED ORDER — DIBUCAINE 1 % RE OINT: 1.0000 "application " | TOPICAL_OINTMENT | RECTAL | Status: DC | PRN

## 2017-07-20 MED ORDER — SODIUM CHLORIDE 0.9 % IV SOLN
1.0000 g | INTRAVENOUS | Status: DC
  Administered 2017-07-20: 1 g via INTRAVENOUS

## 2017-07-20 MED ORDER — FENTANYL 2.5 MCG/ML W/ROPIVACAINE 0.15% IN NS 100 ML EPIDURAL (ARMC): 12.0000 mL/h | EPIDURAL | Status: DC

## 2017-07-20 MED ORDER — SIMETHICONE 80 MG PO CHEW
80.0000 mg | CHEWABLE_TABLET | ORAL | Status: DC | PRN
  Filled 2017-07-20: qty 1

## 2017-07-20 MED ORDER — SODIUM CHLORIDE 0.9 % IV SOLN
2.0000 g | Freq: Once | INTRAVENOUS | Status: AC
  Administered 2017-07-20: 2 g via INTRAVENOUS

## 2017-07-20 MED ORDER — BENZOCAINE-MENTHOL 20-0.5 % EX AERO
INHALATION_SPRAY | CUTANEOUS | Status: AC
  Administered 2017-07-20: 14:00:00
  Filled 2017-07-20: qty 56

## 2017-07-20 MED ORDER — ACETAMINOPHEN 325 MG PO TABS
650.0000 mg | ORAL_TABLET | ORAL | Status: DC | PRN
  Administered 2017-07-20 – 2017-07-21 (×2): 650 mg via ORAL
  Filled 2017-07-20 (×2): qty 2

## 2017-07-20 MED ORDER — SOD CITRATE-CITRIC ACID 500-334 MG/5ML PO SOLN: 30.0000 mL | ORAL | Status: DC | PRN

## 2017-07-20 MED ORDER — FENTANYL 2.5 MCG/ML W/ROPIVACAINE 0.15% IN NS 100 ML EPIDURAL (ARMC)
EPIDURAL | Status: AC
  Filled 2017-07-20: qty 100

## 2017-07-20 MED ORDER — PRENATAL MULTIVITAMIN CH
1.0000 | ORAL_TABLET | Freq: Every day | ORAL | Status: DC
  Administered 2017-07-21: 1 via ORAL
  Filled 2017-07-20: qty 1

## 2017-07-20 MED ORDER — BUPIVACAINE HCL (PF) 0.25 % IJ SOLN
INTRAMUSCULAR | Status: DC | PRN
  Administered 2017-07-20 (×2): 5 mL via EPIDURAL

## 2017-07-20 MED ORDER — TERBUTALINE SULFATE 1 MG/ML IJ SOLN: 0.2500 mg | Freq: Once | INTRAMUSCULAR | Status: DC | PRN

## 2017-07-20 MED ORDER — BENZOCAINE-MENTHOL 20-0.5 % EX AERO: 1.0000 "application " | INHALATION_SPRAY | CUTANEOUS | Status: DC | PRN

## 2017-07-20 NOTE — Anesthesia Procedure Notes (Signed)
Epidural Patient location during procedure: OB Start time: 07/20/2017 10:00 AM End time: 07/20/2017 10:15 AM  Staffing Anesthesiologist: Yves DillARROLL, PAUL Resident/CRNA: Karoline CaldwellSTARR, Brahm Barbeau Performed: resident/CRNA   Preanesthetic Checklist Completed: patient identified, site marked, surgical consent, pre-op evaluation, timeout performed, IV checked, risks and benefits discussed and monitors and equipment checked  Epidural Patient position: sitting Prep: Betadine Patient monitoring: heart rate, continuous pulse ox and blood pressure Approach: midline Location: L4-L5 Injection technique: LOR saline  Needle:  Needle type: Tuohy  Needle gauge: 17 G Needle length: 9 cm and 9 Needle insertion depth: 4 cm Catheter type: closed end flexible Catheter size: 19 Gauge Catheter at skin depth: 9 cm Test dose: negative and 1.5% lidocaine with Epi 1:200 K  Assessment Events: blood not aspirated, injection not painful, no injection resistance, negative IV test and no paresthesia  Additional Notes Pt. Evaluated and documentation done after procedure finished. Patient identified. Risks/Benefits/Options discussed with patient including but not limited to bleeding, infection, nerve damage, paralysis, failed block, incomplete pain control, headache, blood pressure changes, nausea, vomiting, reactions to medication both or allergic, itching and postpartum back pain. Confirmed with bedside nurse the patient's most recent platelet count. Confirmed with patient that they are not currently taking any anticoagulation, have any bleeding history or any family history of bleeding disorders. Patient expressed understanding and wished to proceed. All questions were answered. Sterile technique was used throughout the entire procedure. Please see nursing notes for vital signs. Test dose was given through epidural catheter and negative prior to continuing to dose epidural or start infusion. Warning signs of high block given  to the patient including shortness of breath, tingling/numbness in hands, complete motor block, or any concerning symptoms with instructions to call for help. Patient was given instructions on fall risk and not to get out of bed. All questions and concerns addressed with instructions to call with any issues or inadequate analgesia.   Patient tolerated the insertion well without immediate complications.Reason for block:procedure for pain

## 2017-07-20 NOTE — H&P (Signed)
Obstetric History and Physical  Desiree Jacobson is a 36 y.o. [id number] with IUP at 5310w6d presenting with regular uterine contractions. Patient states she has been having none vaginal bleeding, intact membranes, with active fetal movement.    Denies difficulty breathing or respiratory distress, chest pain, and leg pain or swelling.   Prenatal Course  Source of Care: [facility]-initial visit at 12 wks, total visits: 13  Pregnancy complications or risks: resolved fetal arrhythmia, history of ulcerative proctitis  Prenatal labs and studies:  ABO, Rh: A/Positive/-- (04/11 1558)  Antibody: Negative (04/11 1558)  Rubella: 3.32 (04/11 1558)  Varicella: 663 (04/11 1158)  RPR: Non Reactive (04/11 1558)   HBsAg: Negative (04/11 1558)   HIV: pending  AVW:UJWJXBJYGBS:Positive (08/07 0000)  Glucose fasting and 2 hr: 75, 93 (08/27 1033)  Genetic screening: Declined  Anatomy US: normal (06/14 1459)  Past Medical History:  Diagnosis Date  . Colitis   . Diarrhea   . 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 COLONOSCOPY  09/2014, 06/2016  . NO PAST SURGERIES      OB History  Gravida Para Term Preterm AB Living  5 4 4     4   SAB TAB Ectopic Multiple Live Births          4    # Outcome Date GA Lbr Len/2nd Weight Sex Delivery Anes PTL Lv  5 Current           4 Term 02/20/14 6433w0d  6.1 oz (0.174 kg) F Vag-Spont   LIV  3 Term 11/23/11 4765w0d  7 lb 3.2 oz (3.266 kg) M Vag-Spont   LIV  2 Term 04/21/09 6933w0d  7 lb 6.4 oz (3.357 kg) M Vag-Spont   LIV  1 Term 04/04/08 3075w0d  7 lb 2.4 oz (3.243 kg) M Vag-Spont  N LIV      Social History   Social History  . Marital status: Married    Spouse name: N/A  . Number of children: 4  . Years of education: N/A   Occupational History  . homemaker    Social History Main Topics  . Smoking status: Never Smoker  . Smokeless tobacco: Never Used  . Alcohol use No  . Drug use: No  . Sexual activity: Yes    Partners:  Male    Birth control/ protection: None   Other Topics Concern  . None   Social History Narrative  . None    Family History  Problem Relation Age of Onset  . Cancer Mother   . Brain cancer Mother   . Hypertension Father   . Skin cancer Sister   . Colon cancer Neg Hx     Prescriptions Prior to Admission  Medication Sig Dispense Refill Last Dose  . Prenatal Vit-Fe Fumarate-FA (PRENATAL MULTIVITAMIN) TABS tablet Take 1 tablet by mouth daily at 12 noon.   07/19/2017 at Unknown time  . COD LIVER OIL PO Take 1 capsule by mouth daily.   Not Taking at Unknown time  . folic acid (FOLVITE) 800 MCG tablet Take 400 mcg by mouth daily.   Not Taking at Unknown time  . Probiotic Product (PROBIOTIC-10 PO) Take 1 capsule by mouth daily.   Not Taking at Unknown time    No Known Allergies  Review of Systems: Negative except for what is mentioned in HPI.  Physical Exam:  BP 123/73 (BP Location: Left Arm)   Pulse 85   Temp 98.1 F (  36.7 C) (Oral)   Resp 16   Ht 5\' 6"  (1.676 m)   Wt 162 lb (73.5 kg)   LMP 10/21/2016 (Exact Date)   BMI 26.15 kg/m   GENERAL: Well-developed, well-nourished female in no acute distress.   LUNGS: Clear to auscultation bilaterally.   HEART: Regular rate and rhythm.  ABDOMEN: Soft, nontender, nondistended, gravid.  EXTREMITIES: Nontender, no edema, 2+ distal pulses.  Cervical Exam: Dilation: 6 Effacement (%): 70 Station: -2 Exam by:: Lawhorn  FHT:  Baseline rate 130 bpm   Variability moderate  Accelerations present   Decelerations none  Contractions: Every four (4) to five (5) minutes, soft resting tone   Pertinent Labs/Studies:    No results found for this or any previous visit (from the past 24 hour(s)).  Assessment :  [name] is a 36 y.o. [id number] at [redacted]w[redacted]d being admitted for labor, Rh positive, GBS positive  FHR Category I  Plan:  Labor: Expectant management.  Induction/Augmentation as needed, per protocol  FWB: Reassuring fetal  heart tracing.  GBS positive  Delivery plan: Hopeful for vaginal delivery   Gunnar Bulla, CNM [facility], [facility]

## 2017-07-20 NOTE — Anesthesia Preprocedure Evaluation (Signed)
Anesthesia Evaluation  Patient identified by MRN, date of birth, ID band Patient awake    Reviewed: Allergy & Precautions, H&P , NPO status , Patient's Chart, lab work & pertinent test results  Airway Mallampati: II  TM Distance: >3 FB Neck ROM: full    Dental  (+) Teeth Intact   Pulmonary neg pulmonary ROS,           Cardiovascular negative cardio ROS Normal cardiovascular exam     Neuro/Psych negative neurological ROS  negative psych ROS   GI/Hepatic negative GI ROS, Neg liver ROS,   Endo/Other  negative endocrine ROS  Renal/GU negative Renal ROS  negative genitourinary   Musculoskeletal   Abdominal   Peds  Hematology negative hematology ROS (+)   Anesthesia Other Findings   Reproductive/Obstetrics (+) Pregnancy                             Anesthesia Physical Anesthesia Plan  ASA: II  Anesthesia Plan: Epidural   Post-op Pain Management:    Induction:   PONV Risk Score and Plan:   Airway Management Planned:   Additional Equipment:   Intra-op Plan:   Post-operative Plan:   Informed Consent: I have reviewed the patients History and Physical, chart, labs and discussed the procedure including the risks, benefits and alternatives for the proposed anesthesia with the patient or authorized representative who has indicated his/her understanding and acceptance.     Plan Discussed with: CRNA and Anesthesiologist  Anesthesia Plan Comments:         Anesthesia Quick Evaluation

## 2017-07-20 NOTE — Progress Notes (Signed)
Kela MillinKari L Ahles is a 36 y.o. [id number] at 6449w6d by LMP admitted for active labor  Subjective:  Pt resting quietly in bed with peanut ball between legs. FOB at bedside.   Denies difficulty breathing or respiratory distress, chest pain, abdominal pain, vaginal bleeding, and leg pain or swelling.   Objective:  Temp:  [98.1 F (36.7 C)-98.2 F (36.8 C)] 98.1 F (36.7 C) (10/25 0718) Pulse Rate:  [67-95] 87 (10/25 1204) Resp:  [16-18] 16 (10/25 0718) BP: (115-129)/(61-85) 115/70 (10/25 1204) SpO2:  [98 %-100 %] 99 % (10/25 1255) Weight:  [162 lb (73.5 kg)] 162 lb (73.5 kg) (10/25 0246)  FHT:  FHR: 130 bpm, variability: moderate,  accelerations:  Present,  decelerations:  Absent   UC:   regular, every eight to nine minutes, soft resting tone, pitocin at 4 mu/min  SVE:   Dilation: 6 Effacement (%): 70 Station: -1 Exam by:: LSE  Labs: Lab Results  Component Value Date   WBC 12.1 (H) 07/20/2017   HGB 9.9 (L) 07/20/2017   HCT 30.0 (L) 07/20/2017   MCV 84.9 07/20/2017   PLT 265 07/20/2017    Assessment:  Kela MillinKari L Peral is a 36 y.o. [id number] at 2649w6d admitted for labor, Rh positive, GBS positive, epidural, pitocin  FHR Category I  Plan:  Reviewed plan of care and additional labor augmentation options including AROM. Pt declines AROM and SVE at this time.   Reviewed red flag symptoms and when to call.   Continue orders as written. Reassess as needed.    Gunnar BullaJenkins Michelle Lawhorn, CNM 07/20/2017, 1:02 PM

## 2017-07-21 DIAGNOSIS — Z3A38 38 weeks gestation of pregnancy: Secondary | ICD-10-CM

## 2017-07-21 LAB — CBC
HEMATOCRIT: 27.9 % — AB (ref 35.0–47.0)
Hemoglobin: 9.1 g/dL — ABNORMAL LOW (ref 12.0–16.0)
MCH: 27.9 pg (ref 26.0–34.0)
MCHC: 32.6 g/dL (ref 32.0–36.0)
MCV: 85.5 fL (ref 80.0–100.0)
PLATELETS: 218 10*3/uL (ref 150–440)
RBC: 3.26 MIL/uL — ABNORMAL LOW (ref 3.80–5.20)
RDW: 15.5 % — AB (ref 11.5–14.5)
WBC: 11.4 10*3/uL — AB (ref 3.6–11.0)

## 2017-07-21 LAB — RPR: RPR Ser Ql: NONREACTIVE

## 2017-07-21 MED ORDER — IBUPROFEN 600 MG PO TABS: 600.0000 mg | ORAL_TABLET | Freq: Four times a day (QID) | ORAL | 0 refills | Status: AC

## 2017-07-21 MED ORDER — IBUPROFEN 600 MG PO TABS
600.0000 mg | ORAL_TABLET | Freq: Four times a day (QID) | ORAL | Status: DC
  Administered 2017-07-21: 600 mg via ORAL
  Filled 2017-07-21: qty 1

## 2017-07-21 NOTE — Anesthesia Postprocedure Evaluation (Signed)
Anesthesia Post Note  Patient: Desiree Jacobson  Procedure(s) Performed: AN AD HOC LABOR EPIDURAL  Patient location during evaluation: Mother Baby Anesthesia Type: Epidural Level of consciousness: awake and alert Pain management: pain level controlled Vital Signs Assessment: post-procedure vital signs reviewed and stable Respiratory status: spontaneous breathing, nonlabored ventilation and respiratory function stable Cardiovascular status: stable Postop Assessment: no headache, no backache and epidural receding Anesthetic complications: no     Last Vitals:  Vitals:   07/20/17 2335 07/21/17 0418  BP: 129/74 (!) 110/59  Pulse: 77 65  Resp: 18 20  Temp: 36.6 C 36.4 C  SpO2: 98% 99%    Last Pain:  Vitals:   07/21/17 0614  TempSrc:   PainSc: Asleep                 Starling Mannsurtis,  [name]

## 2017-07-21 NOTE — Progress Notes (Signed)
Post Partum Day 1/2  Subjective:  Pt doing well. FOB and infant at bedside. No questions or concerns.   Denies difficulty breathing or respiratory distress, chest pain, abdominal pain, excessive vaginal bleeding, and leg pain or swelling.   Objective:  Temp:  [97.6 F (36.4 C)-98.4 F (36.9 C)] 98.4 F (36.9 C) (10/26 0759) Pulse Rate:  [58-77] 70 (10/26 0900) Resp:  [18-20] 20 (10/26 0900) BP: (110-130)/(48-74) 120/63 (10/26 0900) SpO2:  [98 %-100 %] 98 % (10/26 0900)  Physical Exam:   General: alert and cooperative  Lochia: appropriate  Uterine Fundus: firm  Incision: healing well, no significant drainage  DVT Evaluation: No evidence of DVT seen on physical exam. Negative Homan's sign.   Recent Labs  07/20/17 0859 07/21/17 0517  HGB 9.9* 9.1*  HCT 30.0* 27.9*    Assessment/Plan: Discharge home and Breastfeeding   LOS: 1 day    Desiree Jacobson, CNM 07/21/2017, 1:33 PM

## 2017-07-21 NOTE — Discharge Summary (Signed)
Obstetric Discharge Summary  Patient ID: Desiree Jacobson MRN: [id number] DOB/AGE: 35-31-1982 35 y.o.   Date of Admission: 07/20/2017 Desiree Jacobson, Desiree Jacobson Charlena Cross(D. Evans, MD)  Date of Discharge: 07/21/2017 Desiree Jacobson, Desiree Jacobson Charlena Cross(D. Evans, MD)  Admitting Diagnosis: Onset of Labor at 5473w6d  Secondary Diagnosis: Anemia in pregnancy  Mode of Delivery: normal spontaneous vaginal delivery     Discharge Diagnosis: No other diagnosis   Intrapartum Procedures: Atificial rupture of membranes and GBS prophylaxis   Post partum procedures: None  Complications: First degree perineal laceration   Brief Hospital Course  Desiree Jacobson is a [id number] who had a SVD on 07/20/2017;  for further details of this birth, please refer to the delivey note.  Patient had an uncomplicated postpartum course.  By time of discharge on PPD#1, her pain was controlled on oral pain medications; she had appropriate lochia and was ambulating, voiding without difficulty and tolerating regular diet.  She was deemed stable for discharge to home.    Labs: CBC Latest Ref Rng & Units 07/21/2017 07/20/2017 05/22/2017  WBC 3.6 - 11.0 K/uL 11.4(H) 12.1(H) 11.8(H)  Hemoglobin 12.0 - 16.0 g/dL 1.4(N9.1(L) 8.2(N9.9(L) 10.1(L)  Hematocrit 35.0 - 47.0 % 27.9(L) 30.0(L) 29.8(L)  Platelets 150 - 440 K/uL 218 265 249   A POS  Physical exam:   Temp:  [97.6 F (36.4 C)-98.8 F (37.1 C)] 98.8 F (37.1 C) (10/26 1435) Pulse Rate:  [60-77] 70 (10/26 0900) Resp:  [18-20] 20 (10/26 0900) BP: (110-129)/(59-74) 120/63 (10/26 0900) SpO2:  [98 %-99 %] 98 % (10/26 0900)  General: alert and no distress  Lochia: appropriate  Abdomen: soft, NT  Uterine Fundus: firm  Perinuem: healing well, no significant drainage, no dehiscence, no significant erythema  Extremities: No evidence of DVT seen on physical exam. No lower extremity edema.  Discharge Instructions: Per After Visit Summary.  Activity: Advance as tolerated. Pelvic rest for 6 weeks.  Also refer  to After Visit Summary  Diet: Regular  Medications:  Allergies as of 07/21/2017   No Known Allergies     Medication List    STOP taking these medications   COD LIVER OIL PO     TAKE these medications   folic acid 800 MCG tablet Commonly known as:  FOLVITE Take 400 mcg by mouth daily.   ibuprofen 600 MG tablet Commonly known as:  ADVIL,MOTRIN Take 1 tablet (600 mg total) by mouth every 6 (six) hours.   prenatal multivitamin Tabs tablet Take 1 tablet by mouth daily at 12 noon.   PROBIOTIC-10 PO Take 1 capsule by mouth daily.      Outpatient follow up:  Follow-up Information    Desiree Jacobson, Desiree Jacobson, Desiree Jacobson Follow up.   Specialties:  Certified Nurse Midwife, Obstetrics and Gynecology, Radiology Why:  Please call to schedule six (6) week postpartum visit.  Contact information: [address] [place] [id number] [phone]          Postpartum contraception: natural family planning (NFP)  Discharged Condition: stable  Discharged to: home   Newborn Data:  Disposition:home with mother  Apgars: APGAR (1 MIN): 8   APGAR (5 MINS): 9    Baby Feeding: Breast     Desiree Jacobson, Desiree Jacobson

## 2017-07-21 NOTE — Progress Notes (Signed)
Pt discharged with infant.  Discharge instructions, prescriptions and follow up appointment given to and reviewed with pt. Pt verbalized understanding. Escorted out by staff. 

## 2017-07-26 ENCOUNTER — Encounter: Payer: Self-pay | Admitting: Certified Nurse Midwife

## 2017-08-04 ENCOUNTER — Telehealth: Payer: Self-pay | Admitting: Certified Nurse Midwife

## 2017-08-04 ENCOUNTER — Encounter: Payer: Self-pay | Admitting: Certified Nurse Midwife

## 2017-08-04 NOTE — Telephone Encounter (Signed)
The patient called and stated that she had very heavy bleeding last night and large pieces of tissue "clots" coming out as well. The patient is very concerned and would like a call back from her nurse as soon as possible. No other information was disclosed. Please advise.

## 2017-08-04 NOTE — Telephone Encounter (Signed)
Pt is s/p svd 07/20/2017. She c/o of last nite passing 2-3 large clots, 2-3 pieces of tissue, and having heavy bleeding for 20 minutes. Pos for cramps. It has since tapered off. Now light bleeding only. No fevers. She is not light headed or dizzy. Did go on a 20 minute walk yesterday. Pt concerned it is her placenta. Discussed with JML and she advised that she did not think it was her placenta. She is to monitor her bleeding and watch for fevers. Contact office if soaking a pad q 30 minutes or a fever over 100.3. Pt voices understanding.   Side note- pt took picture of tissue and is going to try and sent them thru my chart for JML to view.

## 2017-08-07 IMAGING — US US MFM OB DETAIL+14 WK
1 of 2 series · 12 of 28 positions shown · non-contrast
Comparison: none

PATIENT INFO:

PERFORMED BY:
SERVICE(S) PROVIDED:
INDICATIONS:
21 weeks gestation of pregnancy
Audible fetal arrhythmia
FETAL EVALUATION:
Num Of Fetuses:     1
Fetal Heart         152
Rate(bpm):
Fetal Lie:          Maternal Left
Presentation:       Transverse, head to maternal right
Placenta:           Posterior Grade 0, No previa
BIOMETRY:
BPD:        50  mm     G. Age:  21w 1d         42  %    CI:        74.83   %    70 - 86
FL/HC:       19.0  %    15.9 -
HC:      183.4  mm     G. Age:  20w 5d         19  %    HC/AC:       1.08       1.06 -
AC:      169.4  mm     G. Age:  22w 0d         64  %    FL/BPD:      69.6  %
FL:       34.8  mm     G. Age:  21w 0d         30  %    FL/AC:       20.5  %    20 - 24
HUM:      33.5  mm     G. Age:  21w 2d         49  %
CER:      21.8  mm     G. Age:  20w 6d         35  %
CM:        6.3  mm
Est. FW:     422   gm   0 lb 15 oz      50  %
GESTATIONAL AGE:
U/S Today:     21w 2d                                        EDD:   07/22/17
Best:          21w 2d     Det. By:  U/S (03/13/17)           EDD:   07/22/17
ANATOMY:
Cranium:               Within Normal Limits   Aortic Arch:            Normal appearance
Cavum:                 Within Normal Limits   Ductal Arch:            Normal appearance
Ventricles:            Normal appearance      Diaphragm:              Within Normal Limits
Choroid Plexus:        Within Normal Limits   Stomach:                Seen
Cerebellum:            Within Normal Limits   Abdomen:                Within Normal
Limits
Posterior Fossa:       Within Normal Limits   Abdominal Wall:         Normal appearance
Nuchal Fold:           Within Normal Limits   Cord Vessels:           3 vessels
Face:                  Orbits visualized      Kidneys:                Normal appearance
Lips:                  Normal appearance      Bladder:                Seen
Thoracic:              Within Normal Limits   Spine:                  Normal appearance
Heart:                 4-Chamber view         Upper Extremities:      Visualized
appears normal
RVOT:                  Normal appearance      Lower Extremities:      Visualized
LVOT:                  Normal appearance
CERVIX UTERUS ADNEXA:
Cervix
Length:           3.53  cm.

[Series 1: us mfm ob detail+14 wk · 0.22mm/px · 12 of 98 slices shown]
[im 4/98]
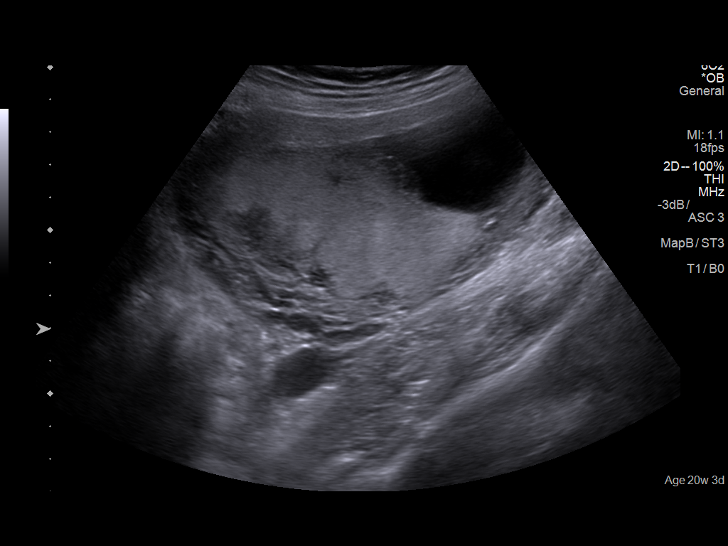
[im 12/98]
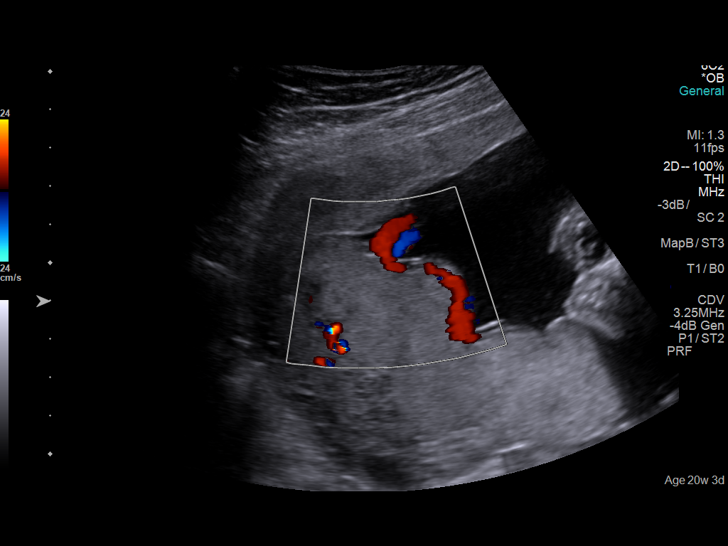
[im 19/98]
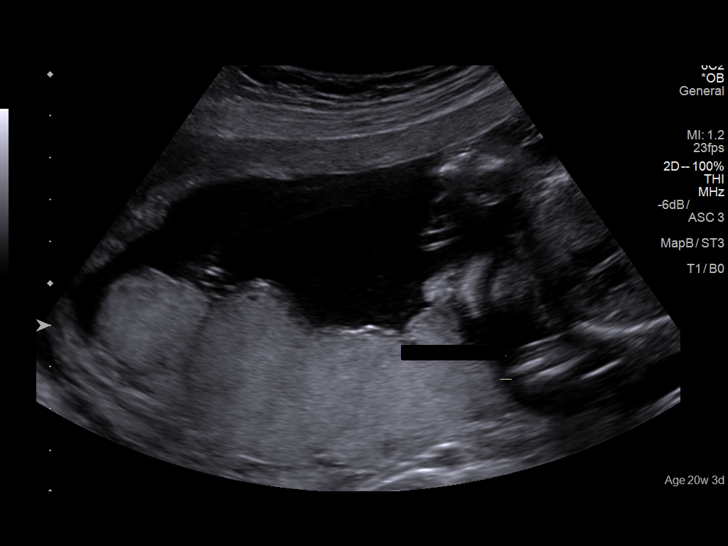
[im 30/98]
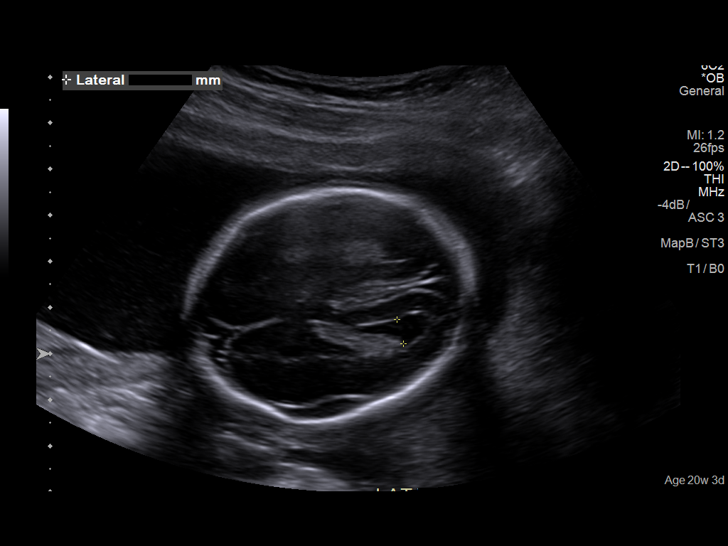
[im 38/98]
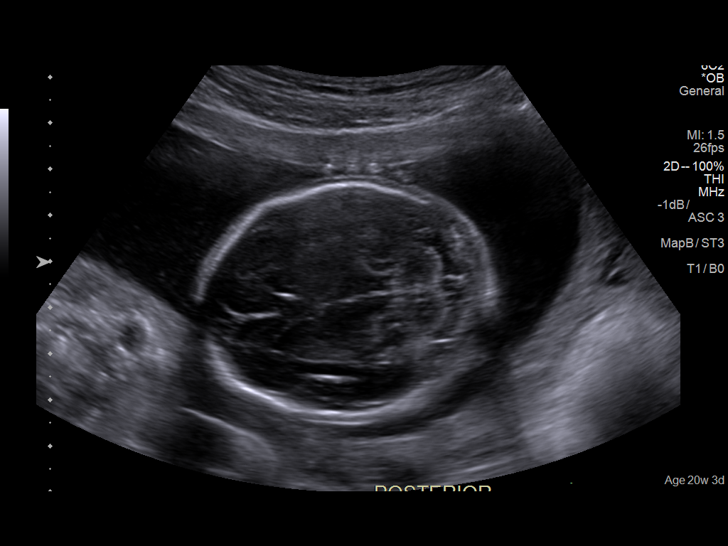
[im 45/98]
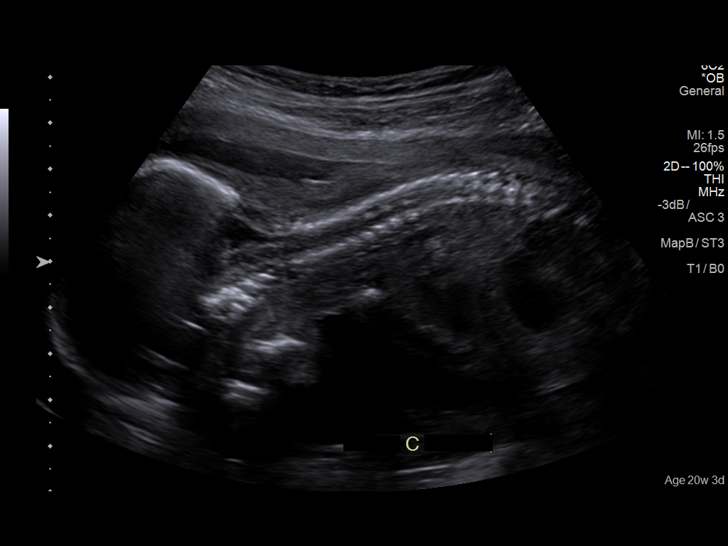
[im 56/98]
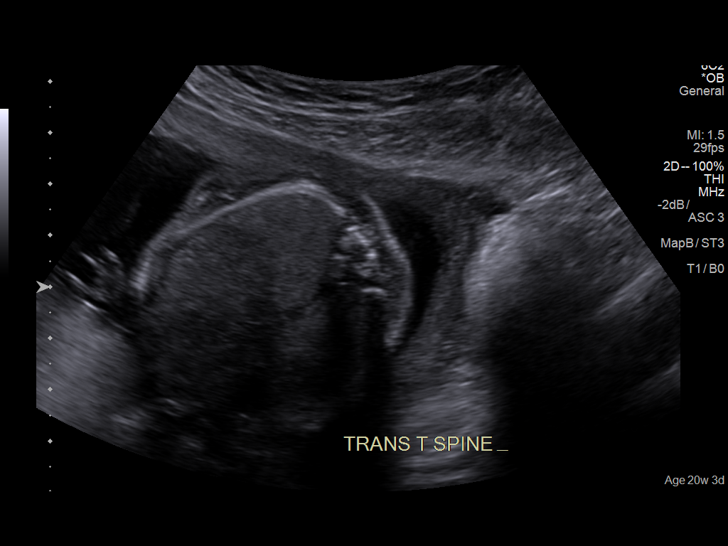
[im 64/98]
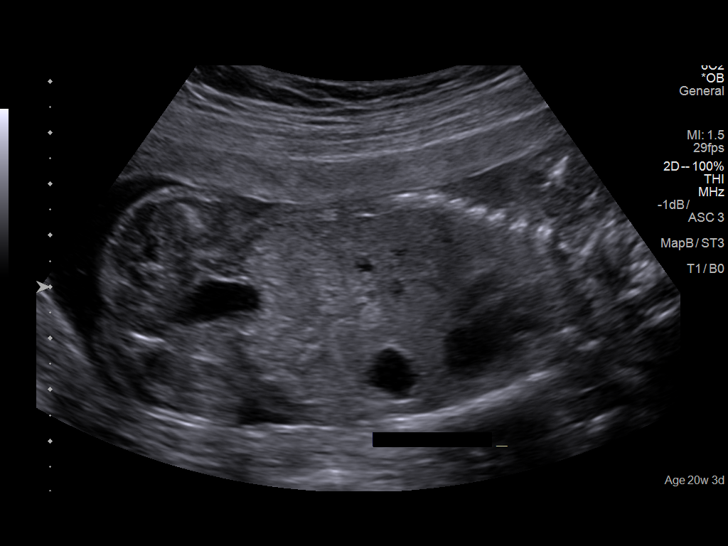
[im 71/98]
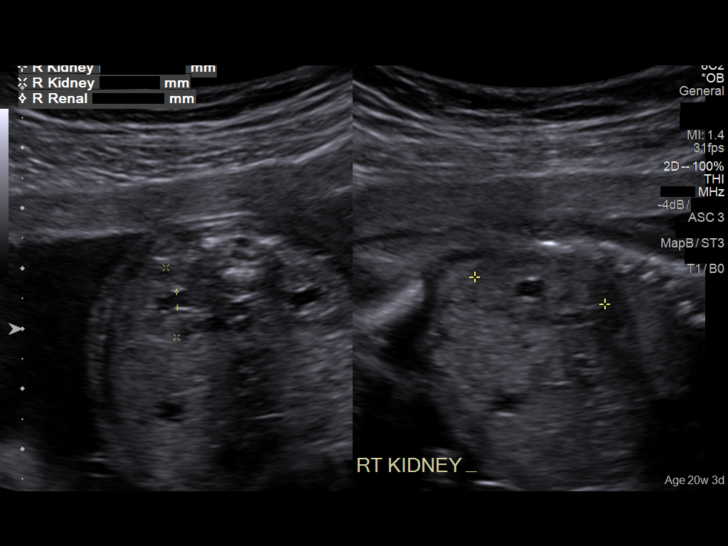
[im 83/98]
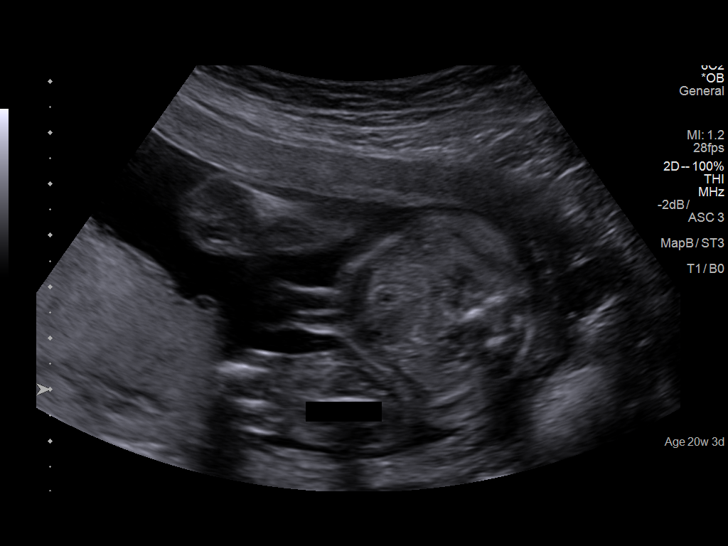
[im 90/98]
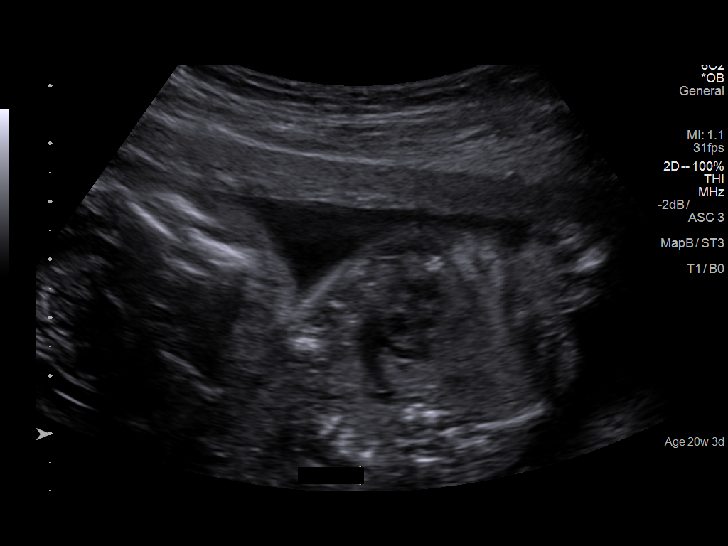
[im 98/98]
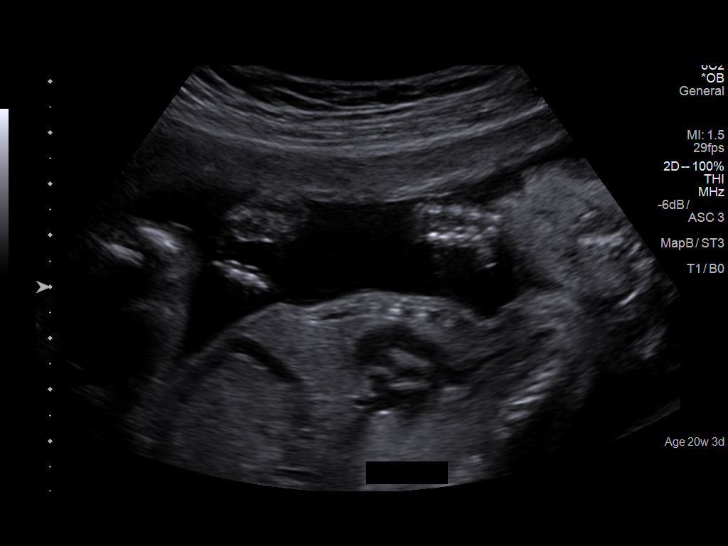

[12 of 28 positions shown; findings below may reference images not displayed]

IMPRESSION: Dear Dr.   NA JET ,

Thank you for referring your patient to [facility] for a
detailed fetal anatomical survey due to irregular fetal heart
rate.

There is a singleton gestation with subjectively normal
amniotic fluid volume.

The fetal biometry correlates with established dating.

Detailed evaluation of the fetal anatomy was performed.

The fetal anatomical survey appears within normal limits
within the resolution of ultrasound as described above.

The cardiac structures were normal in appearance. The atria
and ventricles and great vessels appear to have a normal
configuration. There is a dropped beat that occurs irregularly
without tachycardia. The atria and ventricles beat in
concordance. The irregular rhythm appears most consistent
with frequent PACs. These occur approximately once every
30 secs.

We recommend a fetal echocardiogram to diagnose the
rhythm and interrogate valves and chambers. If these are
PACs and they occur infrequently, and cardiac structure is
found to be normal, we discussed that most of the time they
resolve following delivery. We also discussed that rarely
PACs can lead to difficulty with monitoring the fetal heart rate
during labor and in that scenario, cesarean may be required.
We also reviewed that in the setting of otherwise normal
anatomy and rhythm, these also tend to resolve.

It must be noted that a normal ultrasound cannot rule out
aneuploidy.

Follow-up for growth is advised as clinically indicated.

Thank you for allowing us to participate in your patient's care.
assistance.

## 2017-08-16 ENCOUNTER — Telehealth: Payer: Self-pay | Admitting: Certified Nurse Midwife

## 2017-08-16 NOTE — Telephone Encounter (Signed)
Patient called and stated that she has some questions and concerns about some bleeding she's having. The patient would like to speak with a nurse. No other information was disclosed. Please advise.

## 2017-08-21 NOTE — Telephone Encounter (Signed)
Pt is s/p 4 weeks SVD- She is still having vaginal bleeding. Bright red. Wearing a panty liner. Changing 2 x day. No cramps. NO N/V/D. Pt aware to monitor for now. PPV scheduled for next week. Pt to call for sooner appt if bleeding increases or pain develops.

## 2017-09-01 ENCOUNTER — Encounter: Payer: Self-pay | Admitting: Certified Nurse Midwife

## 2017-09-01 ENCOUNTER — Ambulatory Visit (INDEPENDENT_AMBULATORY_CARE_PROVIDER_SITE_OTHER): Payer: Self-pay | Admitting: Certified Nurse Midwife

## 2017-09-01 DIAGNOSIS — O9081 Anemia of the puerperium: Secondary | ICD-10-CM

## 2017-09-01 NOTE — Patient Instructions (Signed)
Preventive Care 18-39 Years, Female Preventive care refers to lifestyle choices and visits with your health care provider that can promote health and wellness. What does preventive care include?  A yearly physical exam. This is also called an annual well check.  Dental exams once or twice a year.  Routine eye exams. Ask your health care provider how often you should have your eyes checked.  Personal lifestyle choices, including: ? Daily care of your teeth and gums. ? Regular physical activity. ? Eating a healthy diet. ? Avoiding tobacco and drug use. ? Limiting alcohol use. ? Practicing safe sex. ? Taking vitamin and mineral supplements as recommended by your health care provider. What happens during an annual well check? The services and screenings done by your health care provider during your annual well check will depend on your age, overall health, lifestyle risk factors, and family history of disease. Counseling Your health care provider may ask you questions about your:  Alcohol use.  Tobacco use.  Drug use.  Emotional well-being.  Home and relationship well-being.  Sexual activity.  Eating habits.  Work and work Statistician.  Method of birth control.  Menstrual cycle.  Pregnancy history.  Screening You may have the following tests or measurements:  Height, weight, and BMI.  Diabetes screening. This is done by checking your blood sugar (glucose) after you have not eaten for a while (fasting).  Blood pressure.  Lipid and cholesterol levels. These may be checked every 5 years starting at age 66.  Skin check.  Hepatitis C blood test.  Hepatitis B blood test.  Sexually transmitted disease (STD) testing.  BRCA-related cancer screening. This may be done if you have a family history of breast, ovarian, tubal, or peritoneal cancers.  Pelvic exam and Pap test. This may be done every 3 years starting at age 40. Starting at age 59, this may be done every 5  years if you have a Pap test in combination with an HPV test.  Discuss your test results, treatment options, and if necessary, the need for more tests with your health care provider. Vaccines Your health care provider may recommend certain vaccines, such as:  Influenza vaccine. This is recommended every year.  Tetanus, diphtheria, and acellular pertussis (Tdap, Td) vaccine. You may need a Td booster every 10 years.  Varicella vaccine. You may need this if you have not been vaccinated.  HPV vaccine. If you are 69 or younger, you may need three doses over 6 months.  Measles, mumps, and rubella (MMR) vaccine. You may need at least one dose of MMR. You may also need a second dose.  Pneumococcal 13-valent conjugate (PCV13) vaccine. You may need this if you have certain conditions and were not previously vaccinated.  Pneumococcal polysaccharide (PPSV23) vaccine. You may need one or two doses if you smoke cigarettes or if you have certain conditions.  Meningococcal vaccine. One dose is recommended if you are age 27-21 years and a first-year college student living in a residence hall, or if you have one of several medical conditions. You may also need additional booster doses.  Hepatitis A vaccine. You may need this if you have certain conditions or if you travel or work in places where you may be exposed to hepatitis A.  Hepatitis B vaccine. You may need this if you have certain conditions or if you travel or work in places where you may be exposed to hepatitis B.  Haemophilus influenzae type b (Hib) vaccine. You may need this if  you have certain risk factors.  Talk to your health care provider about which screenings and vaccines you need and how often you need them. This information is not intended to replace advice given to you by your health care provider. Make sure you discuss any questions you have with your health care provider. Document Released: 11/08/2001 Document Revised: 06/01/2016  Document Reviewed: 07/14/2015 Elsevier Interactive Patient Education  2017 [employer].

## 2017-09-01 NOTE — Progress Notes (Signed)
Subjective:    Desiree Jacobson is a 36 y.o. [id number] Caucasian female who presents for a postpartum visit. She is 6 weeks postpartum following a spontaneous vaginal birth at 38+6 gestational weeks. Anesthesia: epidural. I have fully reviewed the prenatal and intrapartum course.   Postpartum course has been uncomplicated. Baby's course has been uncomplicated. Baby is feeding by breast. Bleeding staining only. Bowel function is normal. Bladder function is normal.   Contraception method is natural family planning. Postpartum depression screening: negative. Score 2.  Last pap 01/18/2017 and was normal.  The following portions of the patient's history were reviewed and updated as appropriate: allergies, current medications, past medical history, past surgical history and problem list.  Review of Systems  Pertinent items are noted in HPI.   Objective:   BP 124/67   Pulse 84   Ht 5\' 6"  (1.676 m)   Wt 142 lb 6.4 oz (64.6 kg)   Breastfeeding? Yes   BMI 22.98 kg/m   General:  Alert, cooperative and no distress   Breasts:  Deferred, no complaints  Lungs: Clear to auscultation bilaterally  Heart:  Regular rate and rhythm  Abdomen: Declined by patient   Vulva: Declined by patient  Vagina: Declined by patient  Cervix:  Declined by patient  Corpus: Declined by patient  Adnexa:  Declined by patient        Assessment:   Postpartum exam History of postpartum anemia Six (6) wks s/p vaginal birth Breastfeeding Depression screening Contraception counseling   Plan:   Labs: CBC, will contact pt via MyChart with results.   Reviewed red flag symptoms and when to call.   Follow up in: four (4) to nine (9) months  for annual exam or earlier if needed.   Gunnar BullaJenkins Michelle Brennyn Haisley, CNM [facility], [facility]

## 2017-09-02 LAB — CBC
HEMOGLOBIN: 12.1 g/dL (ref 11.1–15.9)
Hematocrit: 37.8 % (ref 34.0–46.6)
MCH: 28.2 pg (ref 26.6–33.0)
MCHC: 32 g/dL (ref 31.5–35.7)
MCV: 88 fL (ref 79–97)
Platelets: 272 10*3/uL (ref 150–379)
RBC: 4.29 x10E6/uL (ref 3.77–5.28)
RDW: 15.4 % (ref 12.3–15.4)
WBC: 7.4 10*3/uL (ref 3.4–10.8)

## 2017-09-16 ENCOUNTER — Telehealth: Payer: Self-pay | Admitting: Internal Medicine

## 2017-09-16 MED ORDER — HYDROCORTISONE ACETATE 25 MG RE SUPP: 25.0000 mg | Freq: Two times a day (BID) | RECTAL | 1 refills | Status: AC | PRN

## 2017-09-16 NOTE — Telephone Encounter (Signed)
Has done well since early 2018 Having bloody mucous dc c/w her prior ulcerative proctitis flares  sxs x 1-2 weeks Is breast-feeding 8 weeks post-partum She said pediatrician thought ok to use HC suppositories (so do I)  Rx HC suppositories bid x 1 week and  prn and advised she make f/u appt next available

## 2017-10-03 ENCOUNTER — Telehealth: Payer: Self-pay | Admitting: Internal Medicine

## 2017-10-03 MED ORDER — HYOSCYAMINE SULFATE 0.125 MG SL SUBL: 0.1250 mg | SUBLINGUAL_TABLET | Freq: Four times a day (QID) | SUBLINGUAL | 0 refills | Status: AC | PRN

## 2017-10-03 NOTE — Telephone Encounter (Signed)
Patient calling with an update on her symptoms.  She reports a fair amount of cramping and spasms that is  keeping her up at night.  Diarrhea has improved.  She is requesting an antispasmodic. Please advise

## 2017-10-03 NOTE — Telephone Encounter (Signed)
Patient notified rx sent 

## 2017-10-03 NOTE — Telephone Encounter (Signed)
Is she breast feeding?

## 2017-10-03 NOTE — Telephone Encounter (Signed)
Follow up arranged for 11/17/17

## 2017-10-03 NOTE — Telephone Encounter (Signed)
Hyoscyamine 0.125mg  SL every 6 hrs prn abdominal cramps # 60 no refill  I think she should make an appointment to see me also

## 2017-10-03 NOTE — Telephone Encounter (Signed)
yes

## 2017-11-17 ENCOUNTER — Ambulatory Visit: Payer: Self-pay | Admitting: Internal Medicine

## 2017-12-25 ENCOUNTER — Other Ambulatory Visit: Payer: Self-pay | Admitting: Internal Medicine

## 2017-12-25 ENCOUNTER — Ambulatory Visit
Admission: RE | Admit: 2017-12-25 | Discharge: 2017-12-25 | Disposition: A | Discharge status: Home / Self Care | Payer: Self-pay | Source: Ambulatory Visit | Attending: Internal Medicine | Admitting: Internal Medicine

## 2017-12-25 DIAGNOSIS — K8020 Calculus of gallbladder without cholecystitis without obstruction: Secondary | ICD-10-CM | POA: Insufficient documentation

## 2017-12-25 DIAGNOSIS — R7989 Other specified abnormal findings of blood chemistry: Secondary | ICD-10-CM

## 2017-12-25 DIAGNOSIS — R945 Abnormal results of liver function studies: Secondary | ICD-10-CM

## 2018-05-21 IMAGING — US US ABDOMEN LIMITED
1 series · 14 of 25 positions shown · non-contrast
Comparison: None.

CLINICAL DATA: Abnormal liver function tests.

EXAM:
ULTRASOUND ABDOMEN LIMITED RIGHT UPPER QUADRANT

[Series 1: us abdomen limited · 0.14mm/px · 14 of 53 slices shown]
[im 1/53]
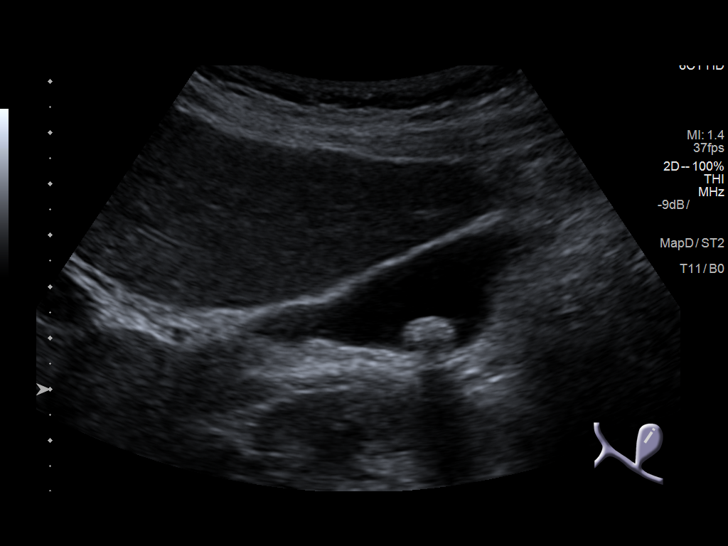
[im 5/53]
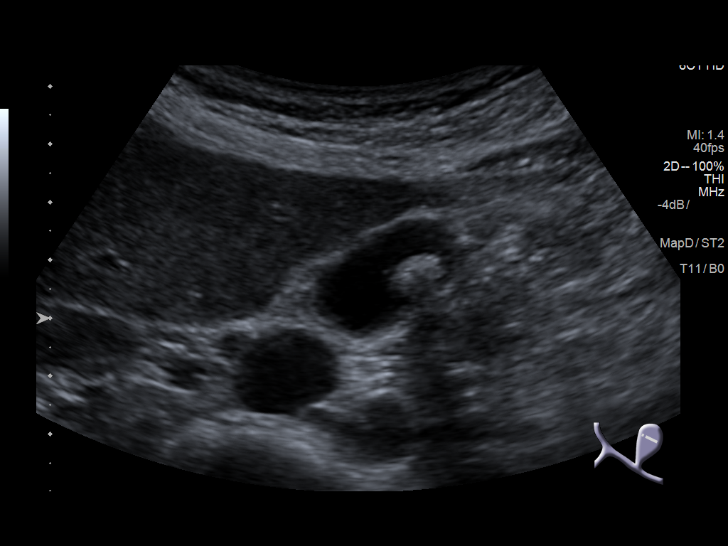
[im 9/53]
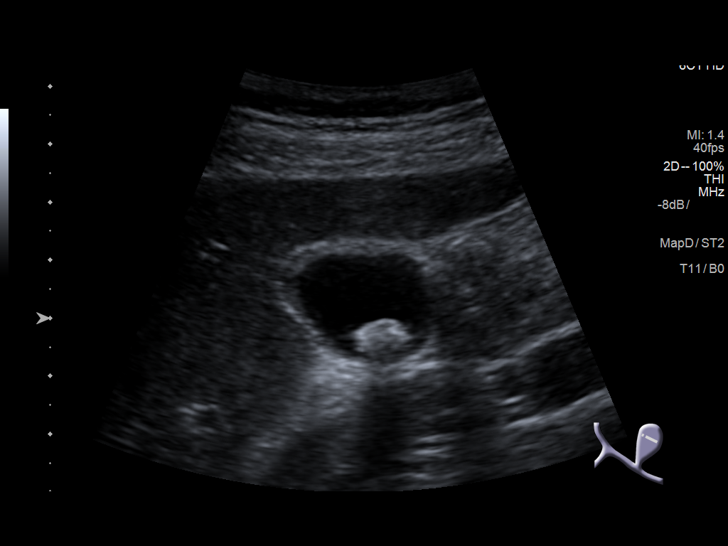
[im 14/53]
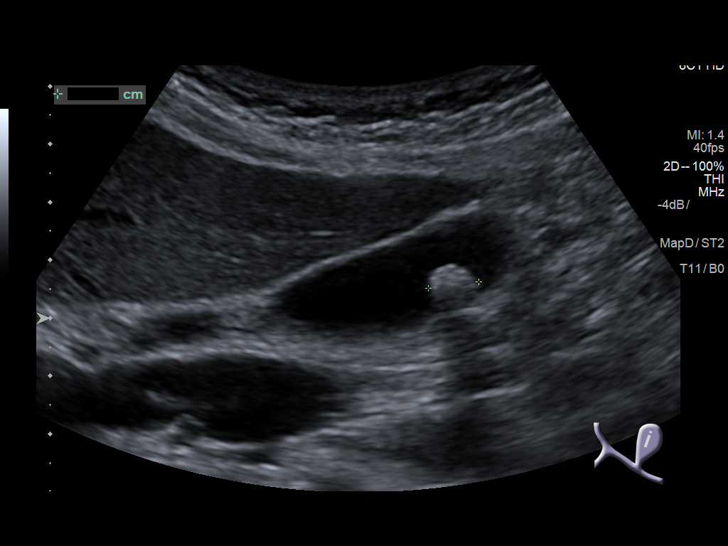
[im 18/53]
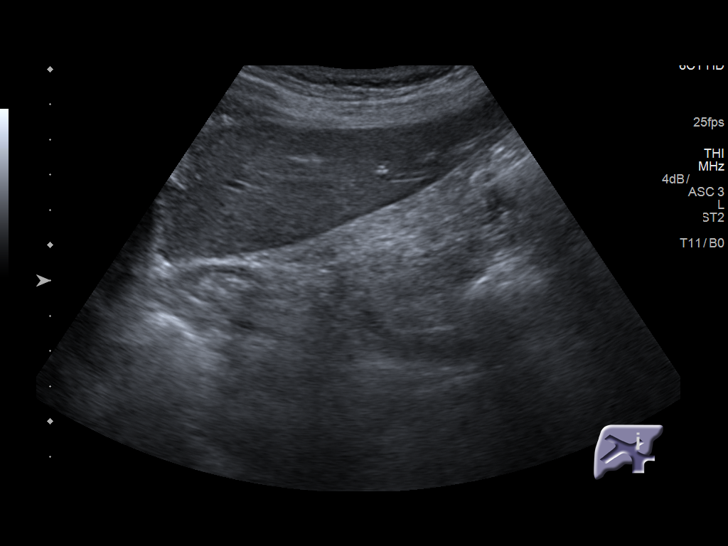
[im 20/53]
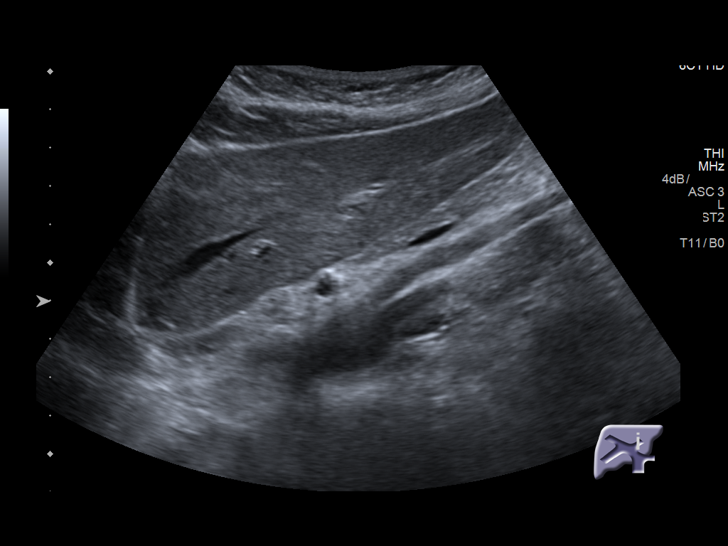
[im 24/53]
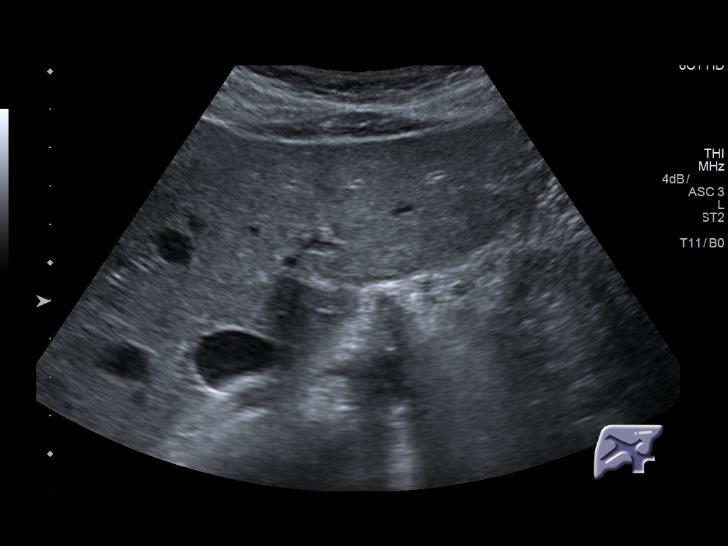
[im 29/53]
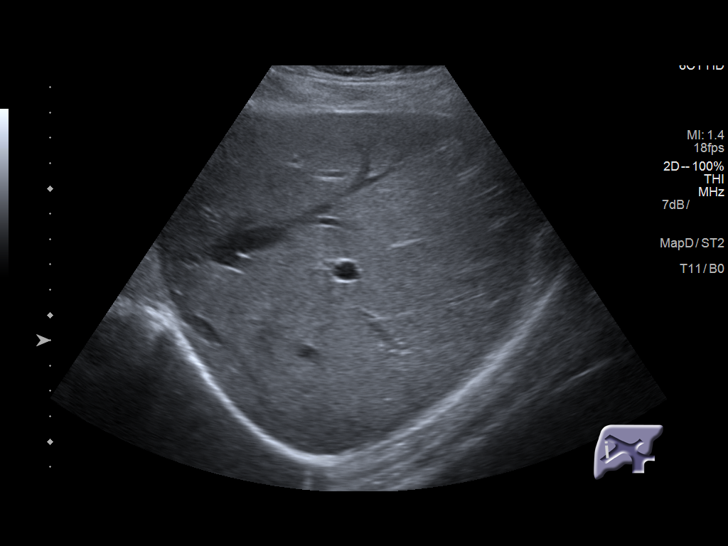
[im 33/53]
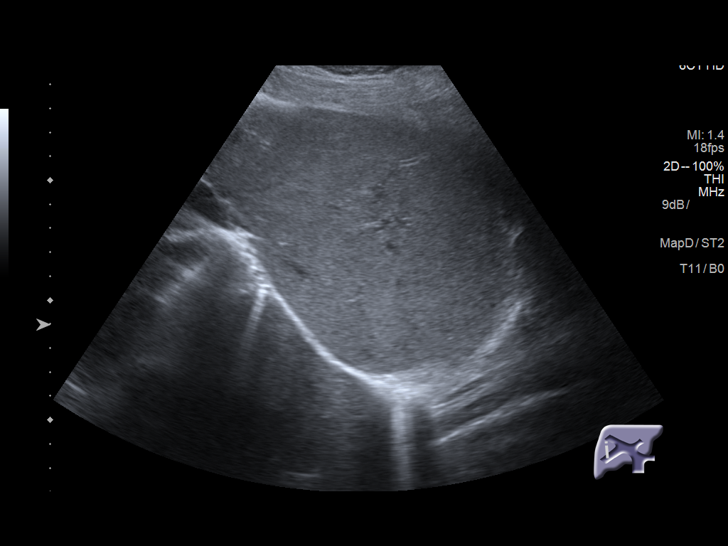
[im 35/53]
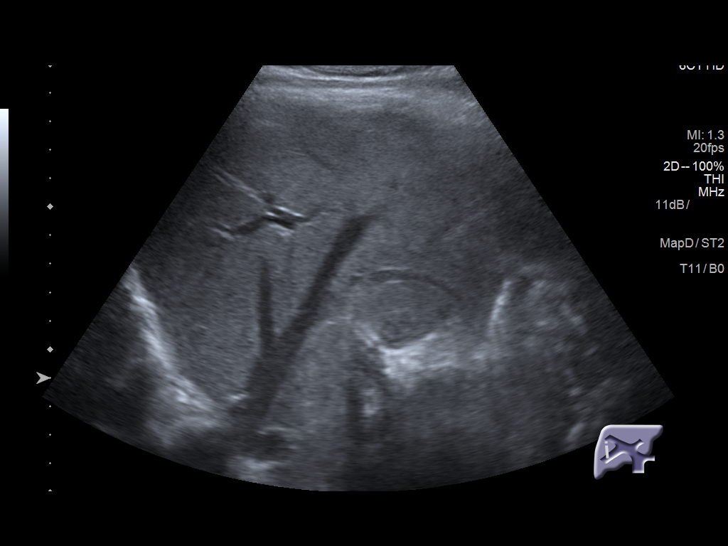
[im 40/53]
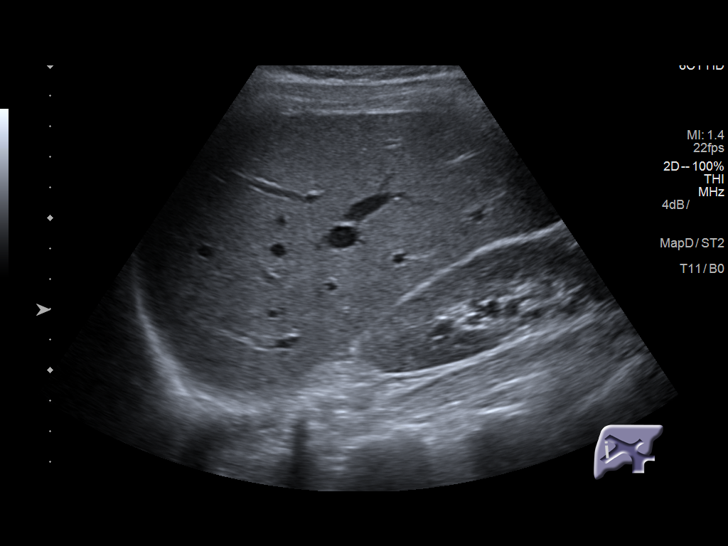
[im 44/53]
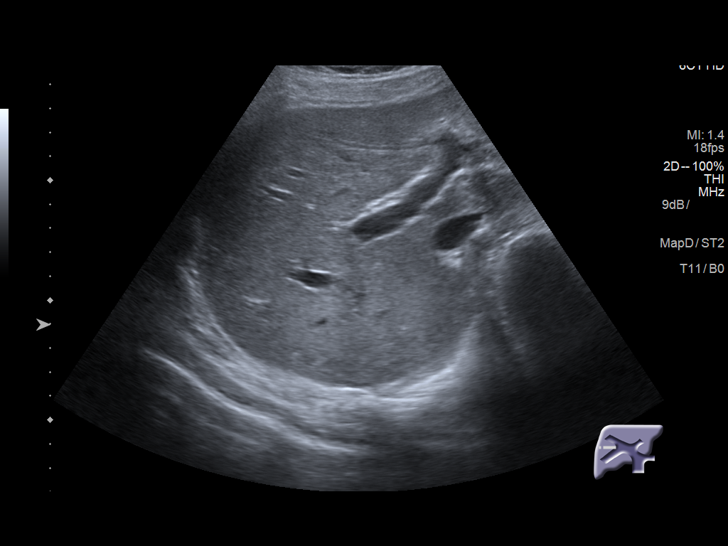
[im 48/53]
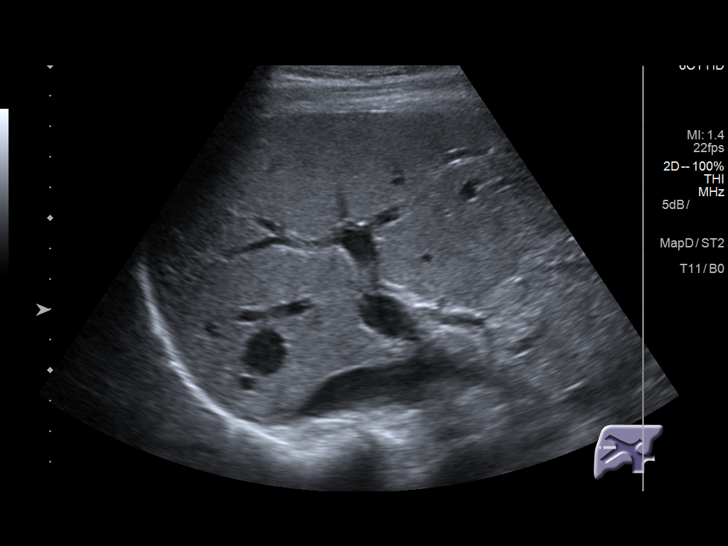
[im 53/53]
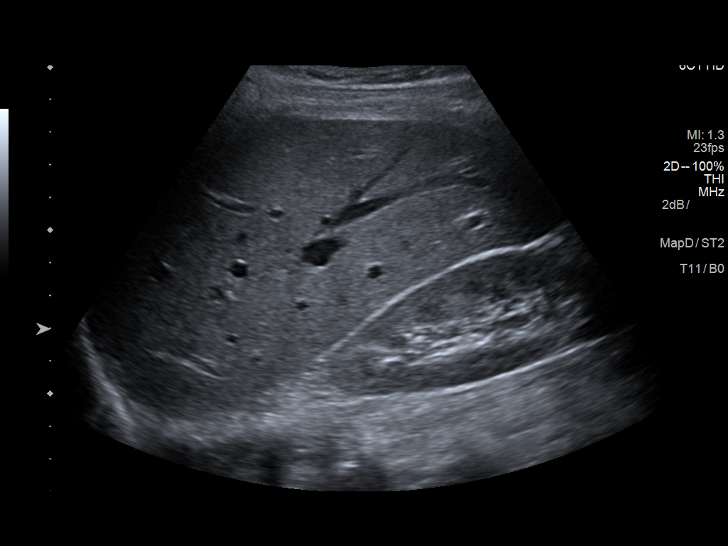

[14 of 25 positions shown; findings below may reference images not displayed]

FINDINGS: Gallbladder:

There is a single mobile 9 mm gallstone. The gallbladder otherwise
appears normal. Gallbladder wall thickness is 1.8 mm, normal.
Negative sonographic Murphy's sign.

Common bile duct:

Diameter: 1.6 mm, normal.

Liver:

No focal lesion identified. Within normal limits in parenchymal
echogenicity. Portal vein is patent on color Doppler imaging with
normal direction of blood flow towards the liver.
IMPRESSION: 1. Solitary mobile gallstone.
2. Otherwise, normal exam including the liver parenchyma.

## 2020-06-19 ENCOUNTER — Ambulatory Visit (HOSPITAL_COMMUNITY)
Admission: RE | Admit: 2020-06-19 | Discharge: 2020-06-19 | Disposition: A | Discharge status: Home / Self Care | Payer: HRSA Program | Source: Ambulatory Visit | Attending: Pulmonary Disease | Admitting: Pulmonary Disease

## 2020-06-19 ENCOUNTER — Other Ambulatory Visit (HOSPITAL_COMMUNITY): Payer: Self-pay | Admitting: Nurse Practitioner

## 2020-06-19 DIAGNOSIS — U071 COVID-19: Secondary | ICD-10-CM | POA: Insufficient documentation

## 2020-06-19 DIAGNOSIS — K51219 Ulcerative (chronic) proctitis with unspecified complications: Secondary | ICD-10-CM

## 2020-06-19 MED ORDER — FAMOTIDINE IN NACL 20-0.9 MG/50ML-% IV SOLN: 20.0000 mg | Freq: Once | INTRAVENOUS | Status: DC | PRN

## 2020-06-19 MED ORDER — DIPHENHYDRAMINE HCL 50 MG/ML IJ SOLN: 50.0000 mg | Freq: Once | INTRAMUSCULAR | Status: DC | PRN

## 2020-06-19 MED ORDER — SODIUM CHLORIDE 0.9 % IV SOLN: INTRAVENOUS | Status: DC | PRN

## 2020-06-19 MED ORDER — EPINEPHRINE 0.3 MG/0.3ML IJ SOAJ: 0.3000 mg | Freq: Once | INTRAMUSCULAR | Status: DC | PRN

## 2020-06-19 MED ORDER — METHYLPREDNISOLONE SODIUM SUCC 125 MG IJ SOLR: 125.0000 mg | Freq: Once | INTRAMUSCULAR | Status: DC | PRN

## 2020-06-19 MED ORDER — ALBUTEROL SULFATE HFA 108 (90 BASE) MCG/ACT IN AERS: 2.0000 | INHALATION_SPRAY | Freq: Once | RESPIRATORY_TRACT | Status: DC | PRN

## 2020-06-19 MED ORDER — SODIUM CHLORIDE 0.9 % IV SOLN
1200.0000 mg | Freq: Once | INTRAVENOUS | Status: AC
  Administered 2020-06-19: 1200 mg via INTRAVENOUS

## 2020-06-19 NOTE — Progress Notes (Signed)
I connected by phone with Desiree Jacobson on 06/19/2020 at 3:46 PM to discuss the potential use of a new treatment for mild to moderate COVID-19 viral infection in non-hospitalized patients.  This patient is a 39 y.o. female that meets the FDA criteria for Emergency Use Authorization of COVID monoclonal antibody casirivimab/imdevimab or bamlanivimab/eteseviamb.  Has a (+) direct SARS-CoV-2 viral test result  Has mild or moderate COVID-19   Is NOT hospitalized due to COVID-19  Is within 10 days of symptom onset  Has at least one of the high risk factor(s) for progression to severe COVID-19 and/or hospitalization as defined in EUA.  Specific high risk criteria : Immunosuppressive Disease or Treatment   I have spoken and communicated the following to the patient or parent/caregiver regarding COVID monoclonal antibody treatment:  1. FDA has authorized the emergency use for the treatment of mild to moderate COVID-19 in adults and pediatric patients with positive results of direct SARS-CoV-2 viral testing who are 62 years of age and older weighing at least 40 kg, and who are at high risk for progressing to severe COVID-19 and/or hospitalization.  2. The significant known and potential risks and benefits of COVID monoclonal antibody, and the extent to which such potential risks and benefits are unknown.  3. Information on available alternative treatments and the risks and benefits of those alternatives, including clinical trials.  4. Patients treated with COVID monoclonal antibody should continue to self-isolate and use infection control measures (e.g., wear mask, isolate, social distance, avoid sharing personal items, clean and disinfect "high touch" surfaces, and frequent handwashing) according to CDC guidelines.   5. The patient or parent/caregiver has the option to accept or refuse COVID monoclonal antibody treatment.  After reviewing this information with the patient, the patient has agreed to  receive one of the available covid 19 monoclonal antibodies and will be provided an appropriate fact sheet prior to infusion. Janeece Agee, NP 06/19/2020 3:46 PM

## 2020-06-19 NOTE — Discharge Instructions (Signed)

## 2020-06-19 NOTE — Progress Notes (Signed)
  Diagnosis: COVID-19  Physician: Dr. Wright  Procedure: Covid Infusion Clinic Med: casirivimab\imdevimab infusion - Provided patient with casirivimab\imdevimab fact sheet for patients, parents and caregivers prior to infusion.  Complications: No immediate complications noted.  Discharge: Discharged home   Desiree Jacobson 06/19/2020  

## 2022-10-12 ENCOUNTER — Other Ambulatory Visit: Payer: Self-pay

## 2022-10-12 DIAGNOSIS — Z1231 Encounter for screening mammogram for malignant neoplasm of breast: Secondary | ICD-10-CM

## 2022-11-07 ENCOUNTER — Other Ambulatory Visit: Payer: Self-pay

## 2022-11-07 ENCOUNTER — Ambulatory Visit
Admission: RE | Admit: 2022-11-07 | Discharge: 2022-11-07 | Disposition: A | Discharge status: Home / Self Care | Payer: Self-pay | Source: Ambulatory Visit | Attending: Obstetrics and Gynecology | Admitting: Obstetrics and Gynecology

## 2022-11-07 ENCOUNTER — Ambulatory Visit: Payer: Self-pay | Attending: Hematology and Oncology | Admitting: Hematology and Oncology

## 2022-11-07 VITALS — BP 116/64 | Wt 150.2 lb

## 2022-11-07 DIAGNOSIS — Z01419 Encounter for gynecological examination (general) (routine) without abnormal findings: Secondary | ICD-10-CM

## 2022-11-07 DIAGNOSIS — Z1231 Encounter for screening mammogram for malignant neoplasm of breast: Secondary | ICD-10-CM

## 2022-11-07 NOTE — Progress Notes (Signed)
Ms. GAYNEL JANAS is a 42 y.o. [id number] female who presents to [facility] clinic today with no complaints.    Pap Smear: Pap smear completed today. Last Pap smear was 2018 at [facility] and was normal. Per patient has no history of an abnormal Pap smear. Last Pap smear result is available in Epic.   Physical exam: Breasts Breasts symmetrical. No skin abnormalities bilateral breasts. No nipple retraction bilateral breasts. No nipple discharge bilateral breasts. No lymphadenopathy. No lumps palpated bilateral breasts.       Pelvic/Bimanual Ext Genitalia No lesions, no swelling and no discharge observed on external genitalia.        Vagina Vagina pink and normal texture. No lesions or discharge observed in vagina.        Cervix Cervix is present. Cervix pink and of normal texture. No discharge observed.    Uterus Uterus is present and palpable. Uterus in normal position and normal size.        Adnexae Bilateral ovaries present and palpable. No tenderness on palpation.         Rectovaginal No rectal exam completed today since patient had no rectal complaints. No skin abnormalities observed on exam.     Smoking History: Patient has never smoked and was not referred to quit line.    Patient Navigation: Patient education provided. Access to services provided for patient through [facility] program. No interpreter provided. No transportation provided   Colorectal Cancer Screening: Per patient has never had colonoscopy completed No complaints today.    Breast and Cervical Cancer Risk Assessment: Patient has family history with her maternal grandmother and maternal great aunt. Patient does not have history of cervical dysplasia, immunocompromised, or DES exposure in-utero.  Risk Scores as of 11/07/2022     Baker Janus           5-year 0.56 %   Lifetime 10.16 %            Last calculated by Demetrius Revel, LPN on [id number] at  1:04 PM        A: [facility] exam with pap  smear No complaints with benign exams.   P: Referred patient to the [facility] for a screening mammogram. Appointment scheduled 11/07/22.  Dayton Scrape A, NP 11/07/2022 1:15 PM

## 2022-11-07 NOTE — Patient Instructions (Signed)
Taught Roda Shutters about self breast awareness and gave educational materials to take home. Patient did need a Pap smear today due to last Pap smear was in 2018 per patient. Let her know [facility] will cover Pap smears every 5 years unless has a history of abnormal Pap smears. [facility] Appointment scheduled for 11/07/22. Patient aware of appointment and will be there. Let patient know will follow up with her within the next couple weeks with results. Roda Shutters verbalized understanding.  Melodye Ped, NP 1:19 PM

## 2022-11-09 LAB — CYTOLOGY - PAP
Comment: NEGATIVE
Diagnosis: NEGATIVE
High risk HPV: NEGATIVE

## 2022-11-10 ENCOUNTER — Telehealth: Payer: Self-pay

## 2022-11-10 NOTE — Telephone Encounter (Signed)
Patient informed negative Pap/HPV results, next pap due in 3-5 years. Patient verbalized understanding.

## 2023-08-10 ENCOUNTER — Other Ambulatory Visit: Payer: Self-pay | Admitting: Internal Medicine

## 2023-08-10 DIAGNOSIS — M519 Unspecified thoracic, thoracolumbar and lumbosacral intervertebral disc disorder: Secondary | ICD-10-CM

## 2023-08-15 ENCOUNTER — Ambulatory Visit
Admission: RE | Admit: 2023-08-15 | Discharge: 2023-08-15 | Disposition: A | Discharge status: Home / Self Care | Payer: No Typology Code available for payment source | Source: Ambulatory Visit | Attending: Internal Medicine | Admitting: Internal Medicine

## 2023-08-15 DIAGNOSIS — M519 Unspecified thoracic, thoracolumbar and lumbosacral intervertebral disc disorder: Secondary | ICD-10-CM

## 2023-08-31 ENCOUNTER — Other Ambulatory Visit: Payer: Self-pay

## 2023-10-24 ENCOUNTER — Encounter: Payer: Self-pay | Admitting: Internal Medicine

## 2023-10-24 DIAGNOSIS — Z1231 Encounter for screening mammogram for malignant neoplasm of breast: Secondary | ICD-10-CM

## 2023-10-30 ENCOUNTER — Other Ambulatory Visit: Payer: Self-pay

## 2023-10-30 DIAGNOSIS — Z1231 Encounter for screening mammogram for malignant neoplasm of breast: Secondary | ICD-10-CM

## 2023-12-04 ENCOUNTER — Ambulatory Visit: Payer: No Typology Code available for payment source | Attending: Hematology and Oncology | Admitting: *Deleted

## 2023-12-04 ENCOUNTER — Ambulatory Visit
Admission: RE | Admit: 2023-12-04 | Discharge: 2023-12-04 | Disposition: A | Discharge status: Home / Self Care | Source: Ambulatory Visit | Attending: Obstetrics and Gynecology | Admitting: Obstetrics and Gynecology

## 2023-12-04 VITALS — BP 116/57 | Wt 146.8 lb

## 2023-12-04 DIAGNOSIS — Z1231 Encounter for screening mammogram for malignant neoplasm of breast: Secondary | ICD-10-CM | POA: Insufficient documentation

## 2023-12-04 DIAGNOSIS — Z1239 Encounter for other screening for malignant neoplasm of breast: Secondary | ICD-10-CM

## 2023-12-04 NOTE — Progress Notes (Signed)
 Ms. Desiree Jacobson is a 43 y.o. female who presents to [facility] clinic today with no complaints.    Pap Smear: Pap smear not completed today. Last Pap smear was 11/07/2022 at [facility] clinic and was normal with negative HPV. Per patient has no history of an abnormal Pap smear. Last Pap smear result is available in Epic.   Physical exam: Breasts Breasts symmetrical. No skin abnormalities bilateral breasts. No nipple retraction bilateral breasts. No nipple discharge bilateral breasts. No lymphadenopathy. No lumps palpated bilateral breasts. No complaints of pain or tenderness on exam.       Pelvic/Bimanual Pap is not indicated today per BCCCP guidelines.    Smoking History: Patient has never smoked.   Patient Navigation: Patient education provided. Access to services provided for patient through [facility] program.  Colorectal Cancer Screening: Per patient has had colonoscopy completed on 10/01/2021.  No complaints today.    Breast and Cervical Cancer Risk Assessment: Patient has family history with her maternal grandmother and maternal great aunt. Patient does not have history of cervical dysplasia, immunocompromised, or DES exposure in-utero.   Risk Scores as of Encounter on 12/04/2023     Desiree Jacobson           5-year 0.62%   Lifetime 10.07%            Last calculated by Narda Rutherford, LPN on 01/02/8118 at 11:22 AM        A: BCCCP exam without pap smear No complaints.  P: Referred patient to the [facility] for a screening mammogram. Appointment scheduled Monday, December 04, 2023 at 1200.  Priscille Heidelberg, RN 12/04/2023 11:23 AM

## 2023-12-04 NOTE — Patient Instructions (Signed)
 Explained breast self awareness with Desiree Jacobson. Patient did not need a Pap smear today due to last Pap smear and HPV typing was 11/07/2022. Let her know BCCCP will cover Pap smears and HPV typing every 5 years unless has a history of abnormal Pap smears. Referred patient to the [facility] for a screening mammogram. Appointment scheduled Monday, December 04, 2023 at 1200. Patient aware of appointment and will be there. Let patient know Delford Field will follow up with her within the next couple weeks with results of mammogram by letter or phone. Desiree Jacobson verbalized understanding.  [name], Kathaleen Maser, RN 11:23 AM

## 2023-12-07 ENCOUNTER — Encounter: Payer: Self-pay | Admitting: Obstetrics & Gynecology

## 2024-03-19 NOTE — Progress Notes (Signed)
 ANNUAL EXAM  Desiree Jacobson is a 43 y.o. female  CHIEF COMPLAINT: Chief Complaint  Patient presents with   Annual Exam    SUBJECTIVE:  Patient running 3 miles 3 times a week.  Back doing a lot better,Tumeric was very helpful to her.  Did have lower spine disease, asymptomatic now.  Bowels very stable ______________________________________________________________________ A comprehensive ROS was negative in all 10 systems reviewed.  ALLERGIES: Patient has no known allergies.  Past Medical History:  Diagnosis Date   Anemia, iron deficiency 05/04/2020   Chronic ulcerative proctitis (CMS/HHS-HCC)     Past Surgical History:  Procedure Laterality Date   SIGMOIDOSCOPY FLEXIBLE W/BIOPSY  10/12/2017   Procedure: SIGMOIDOSCOPY, FLEXIBLE; WITH BIOPSY, SINGLE OR MULTIPLE;  Surgeon: Suzann Inocente Jansky, MD;  Location: [facility];  Service: Gastroenterology;;   COLONOSCOPY W/BIOPSY N/A 04/10/2018   Procedure: COLONOSCOPY, FLEXIBLE; WITH BIOPSY, SINGLE OR MULTIPLE;  Surgeon: Tanda Elijah DELENA SHAUNNA, MD;  Location: [facility];  Service: Gastroenterology;  Laterality: N/A;   COLONOSCOPY      Current Outpatient Medications  Medication Sig Dispense Refill   FLUoxetine (PROZAC) 20 MG capsule TAKE 1 CAPSULE BY MOUTH ONCE DAILY 90 capsule 3   hydrocortisone  (ANUCORT-HC ) 25 mg suppository Place 25 mg rectally 2 (two) times daily as needed     Lactobacil 2-S.thermo-Bifido 1 (VISBIOME ORAL) Take by mouth     LIALDA  1.2 gram EC tablet Take 4 tablets (4.8 g total) by mouth once daily 360 tablet 3   multivitamin tablet Take 1 tablet by mouth once daily     No current facility-administered medications for this visit.    PHYSICAL EXAM: BP 108/64   Pulse 72   Ht 167.6 cm (5' 6)   Wt 63.6 kg (140 lb 3.2 oz)   SpO2 98%   BMI 22.63 kg/m  Body mass index is 22.63 kg/m.  Wt Readings from Last 3 Encounters:  03/19/24 63.6 kg (140 lb 3.2 oz)  10/16/23 65.3 kg (144 lb)  08/09/23 65.5  kg (144 lb 6.4 oz)     BP Readings from Last 3 Encounters:  03/19/24 108/64  10/16/23 112/69  08/09/23 112/74    General: Alert oriented x3  Skin: No suspicious lesions or moles.   Eyes: Sclera and conjunctiva clear; pupils equal round and reactive to light and accommodation; extraocular movements intact Ears: External ears and canal normal; tympanic membranes normal.   Nose: Mucosa healthy without drainage or ulceration Oropharynx: No suspicious lesions Neck: No swelling, masses, stiffness, pain, limited movement, carotid pulses normal bilaterally, thyroid normal size, no masses palpated. No bruits heard. 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     * Patient Goals (pt-stated)        Get off of Prozac.        .phq2  Return in about 1 year (around 03/19/2025) for physical.             *Some images could not be shown.

## 2024-09-18 ENCOUNTER — Encounter: Payer: Self-pay | Admitting: Medical Oncology

## 2024-09-18 ENCOUNTER — Emergency Department: Payer: MEDICAID

## 2024-09-18 ENCOUNTER — Emergency Department
Admission: EM | Admit: 2024-09-18 | Discharge: 2024-09-18 | Disposition: A | Discharge status: Home / Self Care | Payer: MEDICAID | Attending: Emergency Medicine | Admitting: Emergency Medicine

## 2024-09-18 ENCOUNTER — Other Ambulatory Visit: Payer: Self-pay

## 2024-09-18 DIAGNOSIS — J101 Influenza due to other identified influenza virus with other respiratory manifestations: Secondary | ICD-10-CM | POA: Insufficient documentation

## 2024-09-18 DIAGNOSIS — E878 Other disorders of electrolyte and fluid balance, not elsewhere classified: Secondary | ICD-10-CM | POA: Insufficient documentation

## 2024-09-18 DIAGNOSIS — E876 Hypokalemia: Secondary | ICD-10-CM | POA: Insufficient documentation

## 2024-09-18 DIAGNOSIS — R531 Weakness: Secondary | ICD-10-CM | POA: Insufficient documentation

## 2024-09-18 LAB — CBC WITH DIFFERENTIAL/PLATELET
Abs Immature Granulocytes: 0.03 K/uL (ref 0.00–0.07)
Basophils Absolute: 0 K/uL (ref 0.0–0.1)
Basophils Relative: 0 %
Eosinophils Absolute: 0 K/uL (ref 0.0–0.5)
Eosinophils Relative: 0 %
HCT: 33.3 % — ABNORMAL LOW (ref 36.0–46.0)
Hemoglobin: 11.6 g/dL — ABNORMAL LOW (ref 12.0–15.0)
Immature Granulocytes: 0 %
Lymphocytes Relative: 8 %
Lymphs Abs: 0.7 K/uL (ref 0.7–4.0)
MCH: 31 pg (ref 26.0–34.0)
MCHC: 34.8 g/dL (ref 30.0–36.0)
MCV: 89 fL (ref 80.0–100.0)
Monocytes Absolute: 0.9 K/uL (ref 0.1–1.0)
Monocytes Relative: 10 %
Neutro Abs: 7.6 K/uL (ref 1.7–7.7)
Neutrophils Relative %: 82 %
Platelets: 203 K/uL (ref 150–400)
RBC: 3.74 MIL/uL — ABNORMAL LOW (ref 3.87–5.11)
RDW: 11.4 % — ABNORMAL LOW (ref 11.5–15.5)
WBC: 9.3 K/uL (ref 4.0–10.5)
nRBC: 0 % (ref 0.0–0.2)

## 2024-09-18 LAB — LIPASE, BLOOD: Lipase: 36 U/L (ref 11–51)

## 2024-09-18 LAB — RESP PANEL BY RT-PCR (RSV, FLU A&B, COVID)  RVPGX2
Influenza A by PCR: POSITIVE — AB
Influenza B by PCR: NEGATIVE
Resp Syncytial Virus by PCR: NEGATIVE
SARS Coronavirus 2 by RT PCR: NEGATIVE

## 2024-09-18 LAB — URINALYSIS, ROUTINE W REFLEX MICROSCOPIC
Bilirubin Urine: NEGATIVE
Glucose, UA: NEGATIVE mg/dL
Ketones, ur: 5 mg/dL — AB
Leukocytes,Ua: NEGATIVE
Nitrite: NEGATIVE
Protein, ur: NEGATIVE mg/dL
Specific Gravity, Urine: 1.003 — ABNORMAL LOW (ref 1.005–1.030)
pH: 6 (ref 5.0–8.0)

## 2024-09-18 LAB — LACTIC ACID, PLASMA
Lactic Acid, Venous: 2.2 mmol/L (ref 0.5–1.9)
Lactic Acid, Venous: 1.1 mmol/L (ref 0.5–1.9)

## 2024-09-18 LAB — COMPREHENSIVE METABOLIC PANEL WITH GFR
ALT: 13 U/L (ref 0–44)
AST: 23 U/L (ref 15–41)
Albumin: 3.7 g/dL (ref 3.5–5.0)
Alkaline Phosphatase: 49 U/L (ref 38–126)
Anion gap: 12 (ref 5–15)
BUN: 10 mg/dL (ref 6–20)
CO2: 19 mmol/L — ABNORMAL LOW (ref 22–32)
Calcium: 7.9 mg/dL — ABNORMAL LOW (ref 8.9–10.3)
Chloride: 100 mmol/L (ref 98–111)
Creatinine, Ser: 0.83 mg/dL (ref 0.44–1.00)
GFR, Estimated: >60 mL/min
Glucose, Bld: 126 mg/dL — ABNORMAL HIGH (ref 70–99)
Potassium: 3.2 mmol/L — ABNORMAL LOW (ref 3.5–5.1)
Sodium: 131 mmol/L — ABNORMAL LOW (ref 135–145)
Total Bilirubin: 0.3 mg/dL (ref 0.0–1.2)
Total Protein: 6.6 g/dL (ref 6.5–8.1)

## 2024-09-18 LAB — TROPONIN T, HIGH SENSITIVITY
Troponin T High Sensitivity: <15 ng/L (ref 0–19)
Troponin T High Sensitivity: <15 ng/L (ref 0–19)

## 2024-09-18 LAB — GROUP A STREP BY PCR: Group A Strep by PCR: NOT DETECTED

## 2024-09-18 LAB — CK: Total CK: 48 U/L (ref 38–234)

## 2024-09-18 LAB — POC URINE PREG, ED: Preg Test, Ur: NEGATIVE

## 2024-09-18 MED ORDER — POTASSIUM CHLORIDE CRYS ER 20 MEQ PO TBCR
40.0000 meq | EXTENDED_RELEASE_TABLET | Freq: Once | ORAL | Status: AC
  Administered 2024-09-18: 40 meq via ORAL
  Filled 2024-09-18: qty 2

## 2024-09-18 MED ORDER — SODIUM CHLORIDE 0.9 % IV BOLUS
1000.0000 mL | Freq: Once | INTRAVENOUS | Status: AC
  Administered 2024-09-18: 1000 mL via INTRAVENOUS

## 2024-09-18 MED ORDER — ACETAMINOPHEN 325 MG PO TABS
650.0000 mg | ORAL_TABLET | Freq: Once | ORAL | Status: AC
  Administered 2024-09-18: 650 mg via ORAL
  Filled 2024-09-18: qty 2

## 2024-09-18 MED ORDER — OSELTAMIVIR PHOSPHATE 75 MG PO CAPS: 75.0000 mg | ORAL_CAPSULE | Freq: Two times a day (BID) | ORAL | 0 refills | Status: AC

## 2024-09-18 NOTE — ED Provider Notes (Signed)
 "  [facility] Provider Note    Event Date/Time   First MD Initiated Contact with Patient 09/18/24 1024     (approximate)   History   flu symptoms   HPI  Desiree Jacobson is a 43 y.o. female who presents today for evaluation of shortness of breath, cough, and syncope.  Patient's husband is doing most of the talking.  Reports that she was sick for the past 2 weeks with what he thought was a normal cold.  Over the weekend, patient developed something new in which she had bodyaches, cough, and decreased appetite.  Husband and patient report that symptoms worsened this morning and patient syncopized while sitting on the toilet.  She called for her husband, and has been got her up off of the toilet and then she syncopized again.  Husband estimates that she was unconscious for approximately 30 seconds.  There is no writhing movements, no incontinence, no tongue biting.  Patient is primarily concerned about her breathing, reports that she does not feel like she is getting enough air.  She has no history of asthma.  No chest pain.  No fevers.  No abdominal pain.  No nausea or vomiting.  Patient Active Problem List   Diagnosis Date Noted   Ulcerative proctitis (HCC) 06/27/2017          Physical Exam   Triage Vital Signs: ED Triage Vitals [09/18/24 1008]  Encounter Vitals Group     BP (!) 104/58     Girls Systolic BP Percentile      Girls Diastolic BP Percentile      Boys Systolic BP Percentile      Boys Diastolic BP Percentile      Pulse Rate 77     Resp 20     Temp 98.2 F (36.8 C)     Temp Source Oral     SpO2 99 %     Weight 140 lb (63.5 kg)     Height 5' 6 (1.676 m)     Head Circumference      Peak Flow      Pain Score 2     Pain Loc      Pain Education      Exclude from Growth Chart     Most recent vital signs: Vitals:   09/18/24 1354 09/18/24 1356  BP: (!) 108/55 (!) 108/55  Pulse: 72 72  Resp:  20  Temp:  98.1 F (36.7 C)  SpO2: 100%  100%    Physical Exam Vitals and nursing note reviewed.  Constitutional:      General: Awake and alert. Pale, diaphoretic    Appearance: Normal appearance. The patient is normal weight.  HENT:     Head: Normocephalic and atraumatic.     Mouth: Mucous membranes are moist.  Eyes:     General: PERRL. Normal EOMs        Right eye: No discharge.        Left eye: No discharge.     Conjunctiva/sclera: Conjunctivae normal.  Cardiovascular:     Rate and Rhythm: Normal rate and regular rhythm.     Pulses: Normal pulses.  Pulmonary:     Effort: Pulmonary effort is normal. Mildly tachpneic     Breath sounds: Normal breath sounds. No wheezes or rhonchi Abdominal:     Abdomen is soft. There is no abdominal tenderness. No rebound or guarding. No distention. Musculoskeletal:        General: No swelling. Normal  range of motion.     Cervical back: Normal range of motion and neck supple.  Skin:    General: Skin is warm and dry.     Capillary Refill: Capillary refill takes less than 2 seconds.     Findings: No rash.  Neurological:     Mental Status: The patient is awake and alert.      ED Results / Procedures / Treatments   Labs (all labs ordered are listed, but only abnormal results are displayed) Labs Reviewed  RESP PANEL BY RT-PCR (RSV, FLU A&B, COVID)  RVPGX2 - Abnormal; Notable for the following components:      Result Value   Influenza A by PCR POSITIVE (*)    All other components within normal limits  CBC WITH DIFFERENTIAL/PLATELET - Abnormal; Notable for the following components:   RBC 3.74 (*)    Hemoglobin 11.6 (*)    HCT 33.3 (*)    RDW 11.4 (*)    All other components within normal limits  COMPREHENSIVE METABOLIC PANEL WITH GFR - Abnormal; Notable for the following components:   Sodium 131 (*)    Potassium 3.2 (*)    CO2 19 (*)    Glucose, Bld 126 (*)    Calcium 7.9 (*)    All other components within normal limits  LACTIC ACID, PLASMA - Abnormal; Notable for the  following components:   Lactic Acid, Venous 2.2 (*)    All other components within normal limits  URINALYSIS, ROUTINE W REFLEX MICROSCOPIC - Abnormal; Notable for the following components:   Color, Urine STRAW (*)    APPearance CLEAR (*)    Specific Gravity, Urine 1.003 (*)    Hgb urine dipstick SMALL (*)    Ketones, ur 5 (*)    Bacteria, UA RARE (*)    All other components within normal limits  GROUP A STREP BY PCR  LACTIC ACID, PLASMA  LIPASE, BLOOD  CK  POC URINE PREG, ED  TROPONIN T, HIGH SENSITIVITY  TROPONIN T, HIGH SENSITIVITY     EKG     RADIOLOGY I independently reviewed and interpreted imaging and agree with radiologists findings.     PROCEDURES:  Critical Care performed:   Procedures   MEDICATIONS ORDERED IN ED: Medications  sodium chloride  0.9 % bolus 1,000 mL (0 mLs Intravenous Stopped 09/18/24 1216)  sodium chloride  0.9 % bolus 1,000 mL (0 mLs Intravenous Stopped 09/18/24 1349)  acetaminophen  (TYLENOL ) tablet 650 mg (650 mg Oral Given 09/18/24 1218)  potassium chloride  SA (KLOR-CON  M) CR tablet 40 mEq (40 mEq Oral Given 09/18/24 1352)     IMPRESSION / MDM / ASSESSMENT AND PLAN / ED COURSE  I reviewed the triage vital signs and the nursing notes.   Differential diagnosis includes, but is not limited to, influenza, pneumonia, dehydration, electrolyte disarray.  Patient is awake and alert, hemodynamically stable and afebrile.  Appears to be weak, and is pale.  Further workup is indicated.  IV was established and labs were obtained.  Labs are revealing for hypokalemia.  She has a mildly low bicarb at 19.  Glucose is not markedly abnormal.  Her creatinine is within normal limits, no acute kidney injury or acute renal failure.  Her troponin is negative, no fever, doubt myocarditis.  CPK is normal, not consistent with myositis or rhabdomyolysis.  Lactic acid is mildly elevated at 2.2.  She was hydrated with 2 L of normal saline, and lactic acid was  rechecked, and had normalized.  She was mildly  hypokalemic, this was repleted.  I discussed the option for admission given that she syncopized at home, given her lactic acidosis, and her weakness.  However patient declined.  She reports that she feels significantly improved and is able to ambulate to the bathroom unassisted.  Patient was reevaluated multiple times throughout her emergency department stay with improvement of her symptoms.  I offered her admission, however given her improvement throughout her ER stay and her significant improvement of her weakness and overall symptoms, patient preferred to go home.  Tachypnea has resolved.  She was given a prescription for Tamiflu .  We discussed very strict return precautions and the importance of close outpatient follow-up, patient understands and agrees as does her husband.  All questions were answered and patient was discharged in stable condition.  Patient's presentation is most consistent with acute presentation with potential threat to life or bodily function.   Clinical Course as of 09/18/24 1554  Wed Sep 18, 2024  1248 Patient reevaluated, reports that she feels slightly improved but still very weak [JP]  1256 Patient able to ambulate to the bathroom [JP]  1416 Patient reevaluated, appears significantly improved and patient reports that she feels significantly improved.  She would like to be discharged.  She and her husband are both comfortable with this plan [JP]    Clinical Course User Index [JP] Traver Meckes E, PA-C     FINAL CLINICAL IMPRESSION(S) / ED DIAGNOSES   Final diagnoses:  Influenza A  Weakness  Hypokalemia     Rx / DC Orders   ED Discharge Orders          Ordered    oseltamivir  (TAMIFLU ) 75 MG capsule  2 times daily        09/18/24 1420             Note:  This document was prepared using Dragon voice recognition software and may include unintentional dictation errors.   Siddhi Dornbush E, PA-C 09/18/24  1554  "

## 2024-09-18 NOTE — ED Triage Notes (Signed)
 Pt from home via ems- flu like sx's x 2 weeks, symptoms has started to improve until Sunday, then sx's worsened. Pt reports generalized weakness and feeling faint.

## 2024-09-18 NOTE — Discharge Instructions (Addendum)
 You have tested positive for influenza A.  You may take the Tamiflu  as prescribed.  Please return for any new, worsening, or changing symptoms or other concerns.  Remember that you are highly contagious.  It was a pleasure caring for you today.
